# Patient Record
Sex: Female | Born: 1950 | Race: White | Hispanic: No | Marital: Single | Smoking: Current every day smoker
Health system: Southern US, Community
[De-identification: ages and names within clinical notes are randomized; demographics above are authoritative.]

## PROBLEM LIST (undated history)

## (undated) DIAGNOSIS — F419 Anxiety disorder, unspecified: Secondary | ICD-10-CM

## (undated) HISTORY — DX: Anxiety disorder, unspecified: F41.9

## (undated) HISTORY — PX: TUBAL LIGATION: SHX77

---

## 2014-09-12 DIAGNOSIS — F329 Major depressive disorder, single episode, unspecified: Secondary | ICD-10-CM | POA: Insufficient documentation

## 2014-09-12 DIAGNOSIS — F419 Anxiety disorder, unspecified: Secondary | ICD-10-CM

## 2015-10-30 ENCOUNTER — Encounter: Payer: Self-pay | Admitting: Emergency Medicine

## 2015-10-30 ENCOUNTER — Ambulatory Visit
Admission: EM | Admit: 2015-10-30 | Discharge: 2015-10-30 | Disposition: A | Discharge status: Home / Self Care | Payer: Self-pay | Attending: Family Medicine | Admitting: Family Medicine

## 2015-10-30 DIAGNOSIS — Z720 Tobacco use: Secondary | ICD-10-CM

## 2015-10-30 DIAGNOSIS — J209 Acute bronchitis, unspecified: Secondary | ICD-10-CM

## 2015-10-30 DIAGNOSIS — J40 Bronchitis, not specified as acute or chronic: Secondary | ICD-10-CM

## 2015-10-30 MED ORDER — PREDNISONE 10 MG (21) PO TBPK: ORAL_TABLET | ORAL | Status: DC

## 2015-10-30 MED ORDER — ALBUTEROL SULFATE HFA 108 (90 BASE) MCG/ACT IN AERS: 2.0000 | INHALATION_SPRAY | RESPIRATORY_TRACT | Status: DC | PRN

## 2015-10-30 MED ORDER — AZITHROMYCIN 500 MG PO TABS: ORAL_TABLET | ORAL | Status: DC

## 2015-10-30 MED ORDER — HYDROCOD POLST-CPM POLST ER 10-8 MG/5ML PO SUER: 5.0000 mL | Freq: Two times a day (BID) | ORAL | Status: DC | PRN

## 2015-10-30 NOTE — ED Provider Notes (Signed)
CSN: [id number]     Arrival date & time 10/30/15  [id number] History   First MD  Nurses notes were reviewed.  Chief Complaint  Patient presents with  . Cough  . Sinusitis  . Nasal Congestion   Since here because of cough. She says she's been coughing since April. She [place] [facility] [place] causing her to cough and causing her to have shortness of breath. She states the last 2 weeks the shortness of breath is increased she's noticed wheezing and bronchospasm which she doesn't normally have. I did point out to her that at this [facility] they're not bleeding and is she sure the past was causing her shortness of breath and wheezing versus a smoking that she's done for most her life. She did inform me that she knows the smoking is not good and that is still spelled the [facility] when she goes outside she still feels cystic [place] tree is causing her shortness of breath I have discussed with her that I think she may have COPD   (Consider location/radiation/quality/duration/timing/severity/associated sxs/prior Treatment) Patient is a 64 y.o. female presenting with URI. The history is provided by the patient. No language interpreter was used.  URI Presenting symptoms: congestion and rhinorrhea   Severity:  Moderate Onset quality:  Gradual Progression:  Worsening Chronicity:  Chronic Relieved by:  Nothing Associated symptoms: wheezing   Risk factors: being elderly   Risk factors: no chronic kidney disease, no immunosuppression and no recent illness     History reviewed. No pertinent past medical history. Past Surgical History  Procedure Laterality Date  . Tubal ligation     History reviewed. No pertinent family history. Social History  Substance Use Topics  . Smoking status: Current Every Day Smoker    Types: Cigarettes  . Smokeless tobacco: None  . Alcohol Use: No   OB History    No data available     Review of Systems  HENT: Positive for congestion and  rhinorrhea.   Respiratory: Positive for shortness of breath and wheezing.   Cardiovascular: Negative for chest pain.  All other systems reviewed and are negative.   Allergies  Review of patient's allergies indicates no known allergies.  Home Medications   Prior to Admission medications   Medication Sig Start Date End Date Taking? Authorizing Provider  albuterol (PROVENTIL HFA;VENTOLIN HFA) 108 (90 Base) MCG/ACT inhaler Inhale 2 puffs into the lungs every 4 (four) hours as needed for wheezing or shortness of breath. 10/30/15   Hassan RowanEugene Haidan Nhan, MD  azithromycin (ZITHROMAX) 500 MG tablet 1 tablet by mouth daily for 5 days 10/30/15   Hassan RowanEugene Jaydence Vanyo, MD  chlorpheniramine-HYDROcodone [facility](TUSSIONEX PENNKINETIC ER) 10-8 MG/5ML SUER Take 5 mLs by mouth every 12 (twelve) hours as needed for cough. 10/30/15   Hassan RowanEugene Terrica Duecker, MD  predniSONE (STERAPRED UNI-PAK 21 TAB) 10 MG (21) TBPK tablet Sig 6 tablet day 1, 5 tablets day 2, 4 tablets day 3,,3tablets day 4, 2 tablets day 5, 1 tablet day 6 take all tablets orally 10/30/15   Hassan RowanEugene Smith Potenza, MD   Meds Ordered and Administered this Visit  Medications - No data to display  BP 157/62 mmHg  Pulse 64  Temp(Src) 97 F (36.1 C) (Tympanic)  Resp 17  Ht 5\' 7"  (1.702 m)  Wt 140 lb (63.504 kg)  BMI 21.92 kg/m2  SpO2 98% No data found.   Physical Exam  Constitutional: She is oriented to person, place, and time. No distress.  Malaise elderly white female looks older than stated age.  HENT:  Head: Normocephalic and atraumatic.  Right Ear: External ear normal.  Left Ear: External ear normal.  Eyes: Pupils are equal, round, and reactive to light.  Neck: Normal range of motion. Neck supple.  Cardiovascular: Normal heart sounds.   Pulmonary/Chest: No respiratory distress. She has wheezes.  Musculoskeletal: Normal range of motion.  Neurological: She is alert and oriented to person, place, and time. No cranial nerve deficit.  Skin: Skin is warm and dry. She is not  diaphoretic.  Psychiatric: She has a normal mood and affect.  Vitals reviewed.   ED Course  Procedures (including critical care time)  Labs Review Labs Reviewed - No data to display  Imaging Review No results found.   Visual Acuity Review  Right Eye Distance:   Left Eye Distance:   Bilateral Distance:    Right Eye Near:   Left Eye Near:    Bilateral Near:         MDM   1. Bronchitis with bronchospasm   2. Continuous tobacco abuse      Scans patient did not think she should have a chest x-ray. She is comfortable with my physical examination skills diagnosing her with pneumonia but I told her while they may be good pneumonias can be missed with just auscultation. Also concern that patient has not had a chest x-ray or CT scan with her history of smoking to evaluate her for COPD and early lung cancer. 6 complaint to me that she does want to have the chest x-ray now shot on her wishes. She does not have apparently insurance and is waiting until June 2017 for her birthday to be on Medicare. Explained to her things get worse she will need to come back and will need to go ahead with the chest x-ray. She declines work note and. Will give her Tussionex 1 teaspoon twice a day Zithromax 500 daily for 5 days, albuterol rescue inhaler, and also on 6 day course of prednisone for the bronchospasm.      Hassan Rowan, MD 10/30/15 [phone]

## 2015-10-30 NOTE — ED Notes (Signed)
Patient c/o cough and chest congestion that has gotten worse over the past month.

## 2015-10-30 NOTE — Discharge Instructions (Signed)
Strongly suggest chest x-ray today. If cough and congestion continues with once again recommend obtaining a chest x-ray. Also establishing a PCP as soon as possible. Upper Respiratory Infection, Adult Most upper respiratory infections (URIs) are caused by a virus. A URI affects the nose, throat, and upper air passages. The most common type of URI is often called "the common cold." HOME CARE   Take medicines only as told by your doctor.  Gargle warm saltwater or take cough drops to comfort your throat as told by your doctor.  Use a warm mist humidifier or inhale steam from a shower to increase air moisture. This may make it easier to breathe.  Drink enough fluid to keep your pee (urine) clear or pale yellow.  Eat soups and other clear broths.  Have a healthy diet.  Rest as needed.  Go back to work when your fever is gone or your doctor says it is okay.  You may need to stay home longer to avoid giving your URI to others.  You can also wear a face mask and wash your hands often to prevent spread of the virus.  Use your inhaler more if you have asthma.  Do not use any tobacco products, including cigarettes, chewing tobacco, or electronic cigarettes. If you need help quitting, ask your doctor. GET HELP IF:  You are getting worse, not better.  Your symptoms are not helped by medicine.  You have chills.  You are getting more short of breath.  You have brown or red mucus.  You have yellow or brown discharge from your nose.  You have pain in your face, especially when you bend forward.  You have a fever.  You have puffy (swollen) neck glands.  You have pain while swallowing.  You have white areas in the back of your throat. GET HELP RIGHT AWAY IF:   You have very bad or constant:  Headache.  Ear pain.  Pain in your forehead, behind your eyes, and over your cheekbones (sinus pain).  Chest pain.  You have long-lasting (chronic) lung disease and any of the  following:  Wheezing.  Long-lasting cough.  Coughing up blood.  A change in your usual mucus.  You have a stiff neck.  You have changes in your:  Vision.  Hearing.  Thinking.  Mood. MAKE SURE YOU:   Understand these instructions.  Will watch your condition.  Will get help right away if you are not doing well or get worse.   This information is not intended to replace advice given to you by your health care provider. Make sure you discuss any questions you have with your health care provider.   Document Released: 04/05/2008 Document Revised: 03/04/2015 Document Reviewed: 01/23/2014 Elsevier Interactive Patient Education 2016 Elsevier Inc.   Metered Dose Inhaler (No Spacer Used) Inhaled medicines are the basis of treatment for asthma and other breathing problems. Inhaled medicine can only be effective if used properly. Good technique assures that the medicine reaches the lungs. Metered dose inhalers (MDIs) are used to deliver a variety of inhaled medicines. These include quick relief or rescue medicines (such as bronchodilators) and controller medicines (such as corticosteroids). The medicine is delivered by pushing down on a metal canister to release a set amount of spray. If you are using different kinds of inhalers, use your quick relief medicine to open the airways 10-15 minutes before using a steroid, if instructed to do so by your health care provider. If you are unsure which inhalers to  use and the order of using them, ask your health care provider, nurse, or respiratory therapist. HOW TO USE THE INHALER  Remove the cap from the inhaler.  If you are using the inhaler for the first time, you will need to prime it. Shake the inhaler for 5 seconds and release four puffs into the air, away from your face. Ask your health care provider or pharmacist if you have questions about priming your inhaler.  Shake the inhaler for 5 seconds before each breath in  (inhalation).  Position the inhaler so that the top of the canister faces up.  Put your index finger on the top of the medicine canister. Your thumb supports the bottom of the inhaler.  Open your mouth.  Either place the inhaler between your teeth and place your lips tightly around the mouthpiece, or hold the inhaler 1-2 inches away from your open mouth. If you are unsure of which technique to use, ask your health care provider.  Breathe out (exhale) normally and as completely as possible.  Press the canister down with the index finger to release the medicine.  At the same time as the canister is pressed, inhale deeply and slowly until your lungs are completely filled. This should take 4-6 seconds. Keep your tongue down.  Hold the medicine in your lungs for 5-10 seconds (10 seconds is best). This helps the medicine get into the small airways of your lungs.  Breathe out slowly, through pursed lips. Whistling is an example of pursed lips.  Wait at least 1 minute between puffs. Continue with the above steps until you have taken the number of puffs your health care provider has ordered. Do not use the inhaler more than your health care provider directs you to.  Replace the cap on the inhaler.  Follow the directions from your health care provider or the inhaler insert for cleaning the inhaler. If you are using a steroid inhaler, after your last puff, rinse your mouth with water, gargle, and spit out the water. Do not swallow the water. AVOID:  Inhaling before or after starting the spray of medicine. It takes practice to coordinate your breathing with triggering the spray.  Inhaling through the nose (rather than the mouth) when triggering the spray. HOW TO DETERMINE IF YOUR INHALER IS FULL OR NEARLY EMPTY You cannot know when an inhaler is empty by shaking it. Some inhalers are now being made with dose counters. Ask your health care provider for a prescription that has a dose counter if you  feel you need that extra help. If your inhaler does not have a counter, ask your health care provider to help you determine the date you need to refill your inhaler. Write the refill date on a calendar or your inhaler canister. Refill your inhaler 7-10 days before it runs out. Be sure to keep an adequate supply of medicine. This includes making sure it has not expired, and making sure you have a spare inhaler. SEEK MEDICAL CARE IF:  Symptoms are only partially relieved with your inhaler.  You are having trouble using your inhaler.  You experience an increase in phlegm. SEEK IMMEDIATE MEDICAL CARE IF:  You feel little or no relief with your inhalers. You are still wheezing and feeling shortness of breath, tightness in your chest, or both.  You have dizziness, headaches, or a fast heart rate.  You have chills, fever, or night sweats.  There is a noticeable increase in phlegm production, or there is blood in  the phlegm. MAKE SURE YOU:  Understand these instructions.  Will watch your condition.  Will get help right away if you are not doing well or get worse.   This information is not intended to replace advice given to you by your health care provider. Make sure you discuss any questions you have with your health care provider.   Document Released: 08/15/2007 Document Revised: 11/08/2014 Document Reviewed: 04/05/2013 Elsevier Interactive Patient Education 2016 [employer].  Steps to Quit Smoking  Smoking tobacco can be harmful to your health and can affect almost every organ in your body. Smoking puts you, and those around you, at risk for developing many serious chronic diseases. Quitting smoking is difficult, but it is one of the best things that you can do for your health. It is never too late to quit. WHAT ARE THE BENEFITS OF QUITTING SMOKING? When you quit smoking, you lower your risk of developing serious diseases and conditions, such as:  Lung cancer or lung disease, such as  COPD.  Heart disease.  Stroke.  Heart attack.  Infertility.  Osteoporosis and bone fractures. Additionally, symptoms such as coughing, wheezing, and shortness of breath may get better when you quit. You may also find that you get sick less often because your body is stronger at fighting off colds and infections. If you are pregnant, quitting smoking can help to reduce your chances of having a baby of low birth weight. HOW DO I GET READY TO QUIT? When you decide to quit smoking, create a plan to make sure that you are successful. Before you quit:  Pick a date to quit. Set a date within the next two weeks to give you time to prepare.  Write down the reasons why you are quitting. Keep this list in places where you will see it often, such as on your bathroom mirror or in your car or wallet.  Identify the people, places, things, and activities that make you want to smoke (triggers) and avoid them. Make sure to take these actions:  Throw away all cigarettes at home, at work, and in your car.  Throw away smoking accessories, such as Set designer.  Clean your car and make sure to empty the ashtray.  Clean your home, including curtains and carpets.  Tell your family, friends, and coworkers that you are quitting. Support from your loved ones can make quitting easier.  Talk with your health care provider about your options for quitting smoking.  Find out what treatment options are covered by your health insurance. WHAT STRATEGIES CAN I USE TO QUIT SMOKING?  Talk with your healthcare provider about different strategies to quit smoking. Some strategies include:  Quitting smoking altogether instead of gradually lessening how much you smoke over a period of time. Research shows that quitting "cold [place]" is more successful than gradually quitting.  Attending in-person counseling to help you build problem-solving skills. You are more likely to have success in quitting if you attend  several counseling sessions. Even short sessions of 10 minutes can be effective.  Finding resources and support systems that can help you to quit smoking and remain smoke-free after you quit. These resources are most helpful when you use them often. They can include:  Online chats with a Veterinary surgeon.  Telephone quitlines.  Printed Materials engineer.  Support groups or group counseling.  Text messaging programs.  Mobile phone applications.  Taking medicines to help you quit smoking. (If you are pregnant or breastfeeding, talk with your health  care provider first.) Some medicines contain nicotine and some do not. Both types of medicines help with cravings, but the medicines that include nicotine help to relieve withdrawal symptoms. Your health care provider may recommend:  Nicotine patches, gum, or lozenges.  Nicotine inhalers or sprays.  Non-nicotine medicine that is taken by mouth. Talk with your health care provider about combining strategies, such as taking medicines while you are also receiving in-person counseling. Using these two strategies together makes you more likely to succeed in quitting than if you used either strategy on its own. If you are pregnant or breastfeeding, talk with your health care provider about finding counseling or other support strategies to quit smoking. Do not take medicine to help you quit smoking unless told to do so by your health care provider. WHAT THINGS CAN I DO TO MAKE IT EASIER TO QUIT? Quitting smoking might feel overwhelming at first, but there is a lot that you can do to make it easier. Take these important actions:  Reach out to your family and friends and ask that they support and encourage you during this time. Call telephone quitlines, reach out to support groups, or work with a counselor for support.  Ask people who smoke to avoid smoking around you.  Avoid places that trigger you to smoke, such as bars, parties, or smoke-break areas at  work.  Spend time around people who do not smoke.  Lessen stress in your life, because stress can be a smoking trigger for some people. To lessen stress, try:  Exercising regularly.  Deep-breathing exercises.  Yoga.  Meditating.  Performing a body scan. This involves closing your eyes, scanning your body from head to toe, and noticing which parts of your body are particularly tense. Purposefully relax the muscles in those areas.  Download or purchase mobile phone or tablet apps (applications) that can help you stick to your quit plan by providing reminders, tips, and encouragement. There are many free apps, such as QuitGuide from the [employer] developer for Disease Control and Prevention). You can find other support for quitting smoking (smoking cessation) through smokefree.gov and other websites. HOW WILL I FEEL WHEN I QUIT SMOKING? Within the first 24 hours of quitting smoking, you may start to feel some withdrawal symptoms. These symptoms are usually most noticeable 2-3 days after quitting, but they usually do not last beyond 2-3 weeks. Changes or symptoms that you might experience include:  Mood swings.  Restlessness, anxiety, or irritation.  Difficulty concentrating.  Dizziness.  Strong cravings for sugary foods in addition to nicotine.  Mild weight gain.  Constipation.  Nausea.  Coughing or a sore throat.  Changes in how your medicines work in your body.  A depressed mood.  Difficulty sleeping (insomnia). After the first 2-3 weeks of quitting, you may start to notice more positive results, such as:  Improved sense of smell and taste.  Decreased coughing and sore throat.  Slower heart rate.  Lower blood pressure.  Clearer skin.  The ability to breathe more easily.  Fewer sick days. Quitting smoking is very challenging for most people. Do not get discouraged if you are not successful the first time. Some people need to make many attempts to quit before they  achieve long-term success. Do your best to stick to your quit plan, and talk with your health care provider if you have any questions or concerns.   This information is not intended to replace advice given to you by your health care provider. Make sure  you discuss any questions you have with your health care provider.   Document Released: 10/12/2001 Document Revised: 03/04/2015 Document Reviewed: 03/04/2015 Elsevier Interactive Patient Education [employer]2016 Elsevier Inc.

## 2016-04-09 ENCOUNTER — Ambulatory Visit
Admission: EM | Admit: 2016-04-09 | Discharge: 2016-04-09 | Disposition: A | Discharge status: Home / Self Care | Payer: Medicare Other | Attending: Family Medicine | Admitting: Family Medicine

## 2016-04-09 DIAGNOSIS — K602 Anal fissure, unspecified: Secondary | ICD-10-CM

## 2016-04-09 DIAGNOSIS — K921 Melena: Secondary | ICD-10-CM | POA: Diagnosis not present

## 2016-04-09 NOTE — Discharge Instructions (Signed)
Anal Fissure, Adult An anal fissure is a small tear or crack in the skin around the anus. Bleeding from a fissure usually stops on its own within a few minutes. However, bleeding will often occur again with each bowel movement until the crack heals. CAUSES This condition may be caused by:  Passing large, hard stool (feces).  Frequent diarrhea.  Constipation.  Inflammatory bowel disease (Crohn disease or ulcerative colitis).  Infections.  Anal sex. SYMPTOMS Symptoms of this condition include:  Bleeding from the rectum.  Small amounts of blood seen on your stool, on toilet paper, or in the toilet after a bowel movement.  Painful bowel movements.  Itching or irritation around the anus. DIAGNOSIS A health care provider may diagnose this condition by closely examining the anal area. An anal fissure can usually be seen with careful inspection. In some cases, a rectal exam may be performed, or a short tube (anoscope) may be used to examine the anal canal. TREATMENT Treatment for this condition may include:  Taking steps to avoid constipation. This may include making changes to your diet, such as increasing your intake of fiber or fluid.  Taking fiber supplements. These supplements can soften your stool to help make bowel movements easier. Your health care provider may also prescribe a stool softener if your stool is often hard.  Taking sitz baths. This may help to heal the tear.  Using medicated creams or ointments. These may be prescribed to lessen discomfort. HOME CARE INSTRUCTIONS Eating and Drinking  Avoid foods that may be constipating, such as bananas and dairy products.  Drink enough fluid to keep your urine clear or pale yellow.  Maintain a diet that is high in fruits, whole grains, and vegetables. General Instructions  Keep the anal area as clean and dry as possible.  Take sitz baths as told by your health care provider. Do not use soap in the sitz baths.  Take  over-the-counter and prescription medicines only as told by your health care provider.  Use creams or ointments only as told by your health care provider.  Keep all follow-up visits as told by your health care provider. This is important. SEEK MEDICAL CARE IF:  You have more bleeding.  You have a fever.  You have diarrhea that is mixed with blood.  You continue to have pain.  Your problem is getting worse rather than better.   This information is not intended to replace advice given to you by your health care provider. Make sure you discuss any questions you have with your health care provider.   Document Released: 10/18/2005 Document Revised: 07/09/2015 Document Reviewed: 01/13/2015 Elsevier Interactive Patient Education 2016 Elsevier Inc.   Recommend schedule appointment/establish care with PCP and schedule appointment with Gastroenterologist for screening colonoscopy

## 2016-04-09 NOTE — ED Notes (Signed)
Pt c/o abd cramping with 2 loose stools this morning with bright red blood mixed in the stool.

## 2016-04-09 NOTE — ED Provider Notes (Signed)
CSN: [id number]     Arrival date & time 04/09/16  0932 History   None    Chief Complaint  Patient presents with  . Rectal Bleeding   (Consider location/radiation/quality/duration/timing/severity/associated sxs/prior Treatment) HPI Comments: 65 yo female with a c/o bright red blood in toilet this morning after 2 loose stools, associated with mild abdominal cramping. Currently not having any pain, cramping, discomfort. Denies any fevers, chills, nausea/vomiting, unexplained weight loss, sick contacts.  Patient states she has been constipated for the last 3 days, but this morning had 2 small to medium loose stools. States has never had a screening colonoscopy.  Patient is a 65 y.o. female presenting with hematochezia. The history is provided by the patient.  Rectal Bleeding   History reviewed. No pertinent past medical history. Past Surgical History  Procedure Laterality Date  . Tubal ligation     No family history on file. Social History  Substance Use Topics  . Smoking status: Current Every Day Smoker    Types: Cigarettes  . Smokeless tobacco: None  . Alcohol Use: No   OB History    No data available     Review of Systems  Gastrointestinal: Positive for hematochezia.    Allergies  Review of patient's allergies indicates no known allergies.  Home Medications   Prior to Admission medications   Medication Sig Start Date End Date Taking? Authorizing Provider  albuterol (PROVENTIL HFA;VENTOLIN HFA) 108 (90 Base) MCG/ACT inhaler Inhale 2 puffs into the lungs every 4 (four) hours as needed for wheezing or shortness of breath. 10/30/15   Hassan Rowan, MD  azithromycin (ZITHROMAX) 500 MG tablet 1 tablet by mouth daily for 5 days 10/30/15   Hassan Rowan, MD  chlorpheniramine-HYDROcodone [facility] ER) 10-8 MG/5ML SUER Take 5 mLs by mouth every 12 (twelve) hours as needed for cough. 10/30/15   Hassan Rowan, MD  predniSONE (STERAPRED UNI-PAK 21 TAB) 10 MG (21) TBPK tablet Sig  6 tablet day 1, 5 tablets day 2, 4 tablets day 3,,3tablets day 4, 2 tablets day 5, 1 tablet day 6 take all tablets orally 10/30/15   Hassan Rowan, MD   Meds Ordered and Administered this Visit  Medications - No data to display  BP 156/82 mmHg  Pulse 78  Temp(Src) 98 F (36.7 C) (Oral)  Resp 17  Ht  (1.702 m)  Wt 142 lb (64.411 kg)  BMI 22.24 kg/m2  SpO2 100% No data found.   Physical Exam  Constitutional: She appears well-developed and well-nourished. No distress.  Abdominal: Soft. Bowel sounds are normal. She exhibits no distension and no mass. There is no tenderness. There is no rebound and no guarding.  Genitourinary:    Pelvic exam was performed with patient supine.  Non-actively bleeding anal fissure at 6 o'clock position and non-inflamed hemorrhoid at 12 o'clock position  Skin: She is not diaphoretic.  Nursing note and vitals reviewed.   ED Course  Procedures (including critical care time)  Labs Review Labs Reviewed - No data to display  Imaging Review No results found.   Visual Acuity Review  Right Eye Distance:   Left Eye Distance:   Bilateral Distance:    Right Eye Near:   Left Eye Near:    Bilateral Near:         MDM   1. Anal fissure   2. Hematochezia   (discussed with patient hematochezia/bright red blood, possibly/likely secondary to anal fissure, however can not rule out intestinal pathology and strongly recommend patient have  a screening colonoscopy as she has never had one)    Discharge Medication List as of 04/09/2016 11:01 AM     1. diagnosis reviewed with patient 2. Recommend supportive treatment with TUCKS PADS, sitz baths 3. Patient does not have a PCP and has never had a colonoscopy; discussed with patient need/recommendation for screening colonoscopy as they she is overdue; patient verbalizes understanding and states will follow up on this. 4. Follow-up prn if symptoms worsen or don't improve    Payton Mccallumrlando Draya Felker,  MD 04/09/16 1114

## 2016-04-13 ENCOUNTER — Other Ambulatory Visit: Payer: Self-pay

## 2016-04-13 ENCOUNTER — Ambulatory Visit (INDEPENDENT_AMBULATORY_CARE_PROVIDER_SITE_OTHER): Payer: Medicare Other | Admitting: Family Medicine

## 2016-04-13 ENCOUNTER — Encounter: Payer: Self-pay | Admitting: Family Medicine

## 2016-04-13 VITALS — BP 138/78 | HR 81 | Resp 16 | Ht 67.0 in | Wt 139.0 lb

## 2016-04-13 DIAGNOSIS — Z720 Tobacco use: Secondary | ICD-10-CM

## 2016-04-13 DIAGNOSIS — F418 Other specified anxiety disorders: Secondary | ICD-10-CM | POA: Diagnosis not present

## 2016-04-13 DIAGNOSIS — R269 Unspecified abnormalities of gait and mobility: Secondary | ICD-10-CM | POA: Diagnosis not present

## 2016-04-13 DIAGNOSIS — F17200 Nicotine dependence, unspecified, uncomplicated: Secondary | ICD-10-CM | POA: Insufficient documentation

## 2016-04-13 DIAGNOSIS — F419 Anxiety disorder, unspecified: Principal | ICD-10-CM

## 2016-04-13 DIAGNOSIS — E559 Vitamin D deficiency, unspecified: Secondary | ICD-10-CM | POA: Diagnosis not present

## 2016-04-13 DIAGNOSIS — F329 Major depressive disorder, single episode, unspecified: Secondary | ICD-10-CM

## 2016-04-13 DIAGNOSIS — Z23 Encounter for immunization: Secondary | ICD-10-CM | POA: Diagnosis not present

## 2016-04-13 DIAGNOSIS — G2581 Restless legs syndrome: Secondary | ICD-10-CM | POA: Diagnosis not present

## 2016-04-13 MED ORDER — ZOSTER VACCINE LIVE 19400 UNT/0.65ML ~~LOC~~ SUSR: 0.6500 mL | Freq: Once | SUBCUTANEOUS | Status: DC

## 2016-04-13 NOTE — Progress Notes (Signed)
Date:  04/13/2016   Name:  Brenda Briggs   DOB:  05/31/1951   MRN:  [id number]  PCP:  No PCP Per Patient    Chief Complaint: Establish Care   History of Present Illness:  This is a 65 y.o. female seen for initial visit. Hx anxiety/depression on Wellbutrin and Paxil in past, now manages without medication. Still has significant anxiety and doesn't sleep well but not interested in medication at present. 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 Stubbed toes L foot 1.5 weeks ago with some bruising, improving, able to move toes without pain. C/o RLS R > L since menopause, avoids caffeine after 4PM, keeps up at night. Father died kidney ca, mother died MI, both with EtOHism. Last tetanus imm 2009, no hx pneumo or shingles imm. Declines colonoscopy.  Review of Systems:  Review of Systems  Constitutional: Negative for fever and fatigue.  Respiratory: Negative for cough and shortness of breath.   Cardiovascular: Negative for chest pain and leg swelling.  Endocrine: Negative for polyuria.  Genitourinary: Negative for difficulty urinating.  Neurological: Negative for syncope and light-headedness.    Patient Active Problem List   Diagnosis Date Noted  . Smoker 04/13/2016  . Restless leg syndrome 04/13/2016  . Anxiety and depression 09/12/2014    Prior to Admission medications   Medication Sig Start Date End Date Taking? Authorizing Provider  acetaminophen (TYLENOL) 325 MG tablet Take 650 mg by mouth every 6 (six) hours as needed.   Yes Historical Provider, MD  Zoster Vaccine Live, PF, (ZOSTAVAX) 40981 UNT/0.65ML injection Inject 19,400 Units into the skin once. 04/13/16   Schuyler Amor, MD    No Known Allergies  Past Surgical History  Procedure Laterality Date  . Tubal ligation      Social History  Substance Use Topics  . Smoking status: Current Every Day Smoker    Types: Cigarettes  . Smokeless tobacco: None  . Alcohol Use: No    Family History  Problem Relation Age of  Onset  . Heart attack Mother     Medication list has been reviewed and updated.  Physical Examination: BP 138/78 mmHg  Pulse 81  Resp 16  Ht  (1.702 m)  Wt 139 lb (63.05 kg)  BMI 21.77 kg/m2  SpO2 98%  LMP  (LMP Unknown)  Physical Exam  Constitutional: She is oriented to person, place, and time. She appears well-developed and well-nourished.  HENT:  Right Ear: External ear normal.  Left Ear: External ear normal.  Mouth/Throat: Oropharynx is clear and moist.  TM's clear  Eyes: Conjunctivae and EOM are normal. Pupils are equal, round, and reactive to light.  Neck: Neck supple. No thyromegaly present.  Cardiovascular: Normal rate, regular rhythm and normal heart sounds.   Pulmonary/Chest: Effort normal and breath sounds normal.  Abdominal: Soft. She exhibits no distension and no mass. There is no tenderness.  Musculoskeletal: She exhibits no edema.  Lymphadenopathy:    She has no cervical adenopathy.  Neurological: She is alert and oriented to person, place, and time. Coordination normal.  Gait sl wide-based  Skin: Skin is warm and dry.  Psychiatric: She has a normal mood and affect. Her behavior is normal.  Nursing note and vitals reviewed.   Assessment and Plan:  1. Anxiety and depression Declines medication at present - TSH  2. Restless leg syndrome Avoidance info given, consider medication if persists - Comprehensive metabolic panel - CBC  3. Vitamin D deficiency No current supplementation - Vitamin D (25 hydroxy)  4. Gait abnormality Consider PT eval if blood work ok - B12  5. Need for pneumococcal vaccination - Pneumococcal conjugate vaccine 13-valent  6. Smoking Advised cessation - Nurse to provide smoking / tobacco cessation education  Return in about 6 months (around 10/13/2016).  Dionne AnoWilliam M. Kingsley SpittlePlonk, Jr. MD [facility]  04/13/2016

## 2016-04-13 NOTE — Patient Instructions (Signed)
Restless Legs Syndrome Restless legs syndrome is a condition that causes uncomfortable feelings or sensations in the legs, especially while sitting or lying down. The sensations usually cause an overwhelming urge to move the legs. The arms can also sometimes be affected. The condition can range from mild to severe. The symptoms often interfere with a person's ability to sleep. CAUSES The cause of this condition is not known. RISK FACTORS This condition is more likely to develop in:  People who are older than age 50.  Pregnant women. In general, restless legs syndrome is more common in women than in men.  People who have a family history of the condition.  People who have certain medical conditions, such as iron deficiency, kidney disease, Parkinson disease, or nerve damage.  People who take certain medicines, such as medicines for high blood pressure, nausea, colds, allergies, depression, and some heart conditions. SYMPTOMS The main symptom of this condition is uncomfortable sensations in the legs. These sensations may be:  Described as pulling, tingling, prickling, throbbing, crawling, or burning.  Worse while you are sitting or lying down.  Worse during periods of rest or inactivity.  Worse at night, often interfering with your sleep.  Accompanied by a very strong urge to move your legs.  Temporarily relieved by movement of your legs. The sensations usually affect both sides of the body. The arms can also be affected, but this is rare. People who have this condition often have tiredness during the day because of their lack of sleep at night. DIAGNOSIS This condition may be diagnosed based on your description of the symptoms. You may also have tests, including blood tests, to check for other conditions that may lead to your symptoms. In some cases, you may be asked to spend some time in a sleep lab so your sleeping can be monitored. TREATMENT Treatment for this condition is  focused on managing the symptoms. Treatment may include:  Self-help and lifestyle changes.  Medicines. HOME CARE INSTRUCTIONS  Take medicines only as directed by your health care provider.  Try these methods to get temporary relief from the uncomfortable sensations:  Massage your legs.  Walk or stretch.  Take a cold or hot bath.  Practice good sleep habits. For example, go to bed and get up at the same time every day.  Exercise regularly.  Practice ways of relaxing, such as yoga or meditation.  Avoid caffeine and alcohol.  Do not use any tobacco products, including cigarettes, chewing tobacco, or electronic cigarettes. If you need help quitting, ask your health care provider.  Keep all follow-up visits as directed by your health care provider. This is important. SEEK MEDICAL CARE IF: Your symptoms do not improve with treatment, or they get worse.   This information is not intended to replace advice given to you by your health care provider. Make sure you discuss any questions you have with your health care provider.   Document Released: 10/08/2002 Document Revised: 03/04/2015 Document Reviewed: 10/14/2014 Elsevier Interactive Patient Education 2016 [employer]  

## 2016-04-14 ENCOUNTER — Other Ambulatory Visit: Payer: Self-pay

## 2016-04-14 LAB — COMPREHENSIVE METABOLIC PANEL
ALBUMIN: 4.4 g/dL (ref 3.6–4.8)
ALT: 12 IU/L (ref 0–32)
AST: 18 IU/L (ref 0–40)
Albumin/Globulin Ratio: 1.5 (ref 1.2–2.2)
Alkaline Phosphatase: 94 IU/L (ref 39–117)
BILIRUBIN TOTAL: 0.4 mg/dL (ref 0.0–1.2)
BUN/Creatinine Ratio: 28 (ref 12–28)
BUN: 15 mg/dL (ref 8–27)
CO2: 23 mmol/L (ref 18–29)
CREATININE: 0.54 mg/dL — AB (ref 0.57–1.00)
Calcium: 9.8 mg/dL (ref 8.7–10.3)
Chloride: 99 mmol/L (ref 96–106)
GFR, EST AFRICAN AMERICAN: 115 mL/min/{1.73_m2} (ref >59)
GFR, EST NON AFRICAN AMERICAN: 99 mL/min/{1.73_m2} (ref >59)
GLOBULIN, TOTAL: 3 g/dL (ref 1.5–4.5)
GLUCOSE: 73 mg/dL (ref 65–99)
Potassium: 3.8 mmol/L (ref 3.5–5.2)
Sodium: 141 mmol/L (ref 134–144)
TOTAL PROTEIN: 7.4 g/dL (ref 6.0–8.5)

## 2016-04-14 LAB — CBC
HEMATOCRIT: 40.1 % (ref 34.0–46.6)
HEMOGLOBIN: 14.1 g/dL (ref 11.1–15.9)
MCH: 32.8 pg (ref 26.6–33.0)
MCHC: 35.2 g/dL (ref 31.5–35.7)
MCV: 93 fL (ref 79–97)
Platelets: 300 10*3/uL (ref 150–379)
RBC: 4.3 x10E6/uL (ref 3.77–5.28)
RDW: 12.7 % (ref 12.3–15.4)
WBC: 7.6 10*3/uL (ref 3.4–10.8)

## 2016-04-14 LAB — TSH: TSH: 1.12 u[IU]/mL (ref 0.450–4.500)

## 2016-04-14 LAB — VITAMIN D 25 HYDROXY (VIT D DEFICIENCY, FRACTURES): VIT D 25 HYDROXY: 17.4 ng/mL — AB (ref 30.0–100.0)

## 2016-04-14 LAB — VITAMIN B12: Vitamin B-12: 310 pg/mL (ref 211–946)

## 2016-04-14 MED ORDER — VITAMIN D 50 MCG (2000 UT) PO CAPS: 1.0000 | ORAL_CAPSULE | Freq: Every day | ORAL | Status: AC

## 2016-04-14 NOTE — Addendum Note (Signed)
Addended by: Schuyler AmorPLONK, Lyndon Chapel on: 04/14/2016 09:09 AM   Modules accepted: Orders

## 2016-10-13 ENCOUNTER — Ambulatory Visit: Payer: Medicare Other | Admitting: Family Medicine

## 2017-02-01 ENCOUNTER — Ambulatory Visit (INDEPENDENT_AMBULATORY_CARE_PROVIDER_SITE_OTHER): Payer: Medicare Other | Admitting: Family Medicine

## 2017-02-01 ENCOUNTER — Encounter: Payer: Self-pay | Admitting: Family Medicine

## 2017-02-01 VITALS — BP 126/82 | HR 67 | Temp 98.1°F | Resp 16 | Ht 67.0 in | Wt 141.0 lb

## 2017-02-01 DIAGNOSIS — F17200 Nicotine dependence, unspecified, uncomplicated: Secondary | ICD-10-CM

## 2017-02-01 DIAGNOSIS — J3089 Other allergic rhinitis: Secondary | ICD-10-CM

## 2017-02-01 DIAGNOSIS — E559 Vitamin D deficiency, unspecified: Secondary | ICD-10-CM | POA: Diagnosis not present

## 2017-02-01 DIAGNOSIS — G2581 Restless legs syndrome: Secondary | ICD-10-CM | POA: Diagnosis not present

## 2017-02-01 DIAGNOSIS — J309 Allergic rhinitis, unspecified: Secondary | ICD-10-CM | POA: Insufficient documentation

## 2017-02-01 MED ORDER — TRIAMCINOLONE ACETONIDE 55 MCG/ACT NA AERO: 2.0000 | INHALATION_SPRAY | Freq: Every day | NASAL | 12 refills | Status: DC

## 2017-02-01 MED ORDER — CETIRIZINE HCL 10 MG PO TABS: 10.0000 mg | ORAL_TABLET | Freq: Every day | ORAL | 11 refills | Status: DC

## 2017-02-01 MED ORDER — VARENICLINE TARTRATE 0.5 MG X 11 & 1 MG X 42 PO MISC: ORAL | 0 refills | Status: DC

## 2017-02-01 NOTE — Patient Instructions (Signed)
Steps to Quit Smoking Smoking tobacco can be harmful to your health and can affect almost every organ in your body. Smoking puts you, and those around you, at risk for developing many serious chronic diseases. Quitting smoking is difficult, but it is one of the best things that you can do for your health. It is never too late to quit. What are the benefits of quitting smoking? When you quit smoking, you lower your risk of developing serious diseases and conditions, such as:  Lung cancer or lung disease, such as COPD.  Heart disease.  Stroke.  Heart attack.  Infertility.  Osteoporosis and bone fractures. Additionally, symptoms such as coughing, wheezing, and shortness of breath may get better when you quit. You may also find that you get sick less often because your body is stronger at fighting off colds and infections. If you are pregnant, quitting smoking can help to reduce your chances of having a baby of low birth weight. How do I get ready to quit? When you decide to quit smoking, create a plan to make sure that you are successful. Before you quit:  Pick a date to quit. Set a date within the next two weeks to give you time to prepare.  Write down the reasons why you are quitting. Keep this list in places where you will see it often, such as on your bathroom mirror or in your car or wallet.  Identify the people, places, things, and activities that make you want to smoke (triggers) and avoid them. Make sure to take these actions:  Throw away all cigarettes at home, at work, and in your car.  Throw away smoking accessories, such as Scientist, research (medical).  Clean your car and make sure to empty the ashtray.  Clean your home, including curtains and carpets.  Tell your family, friends, and coworkers that you are quitting. Support from your loved ones can make quitting easier.  Talk with your health care provider about your options for quitting smoking.  Find out what treatment  options are covered by your health insurance. What strategies can I use to quit smoking? Talk with your healthcare provider about different strategies to quit smoking. Some strategies include:  Quitting smoking altogether instead of gradually lessening how much you smoke over a period of time. Research shows that quitting "cold [place]" is more successful than gradually quitting.  Attending in-person counseling to help you build problem-solving skills. You are more likely to have success in quitting if you attend several counseling sessions. Even short sessions of 10 minutes can be effective.  Finding resources and support systems that can help you to quit smoking and remain smoke-free after you quit. These resources are most helpful when you use them often. They can include:  Online chats with a Social worker.  Telephone quitlines.  Printed Furniture conservator/restorer.  Support groups or group counseling.  Text messaging programs.  Mobile phone applications.  Taking medicines to help you quit smoking. (If you are pregnant or breastfeeding, talk with your health care provider first.) Some medicines contain nicotine and some do not. Both types of medicines help with cravings, but the medicines that include nicotine help to relieve withdrawal symptoms. Your health care provider may recommend:  Nicotine patches, gum, or lozenges.  Nicotine inhalers or sprays.  Non-nicotine medicine that is taken by mouth. Talk with your health care provider about combining strategies, such as taking medicines while you are also receiving in-person counseling. Using these two strategies together makes you more  likely to succeed in quitting than if you used either strategy on its own. If you are pregnant or breastfeeding, talk with your health care provider about finding counseling or other support strategies to quit smoking. Do not take medicine to help you quit smoking unless told to do so by your health care  provider. What things can I do to make it easier to quit? Quitting smoking might feel overwhelming at first, but there is a lot that you can do to make it easier. Take these important actions:  Reach out to your family and friends and ask that they support and encourage you during this time. Call telephone quitlines, reach out to support groups, or work with a counselor for support.  Ask people who smoke to avoid smoking around you.  Avoid places that trigger you to smoke, such as bars, parties, or smoke-break areas at work.  Spend time around people who do not smoke.  Lessen stress in your life, because stress can be a smoking trigger for some people. To lessen stress, try:  Exercising regularly.  Deep-breathing exercises.  Yoga.  Meditating.  Performing a body scan. This involves closing your eyes, scanning your body from head to toe, and noticing which parts of your body are particularly tense. Purposefully relax the muscles in those areas.  Download or purchase mobile phone or tablet apps (applications) that can help you stick to your quit plan by providing reminders, tips, and encouragement. There are many free apps, such as QuitGuide from the [employer] developer for Disease Control and Prevention). You can find other support for quitting smoking (smoking cessation) through smokefree.gov and other websites. How will I feel when I quit smoking? Within the first 24 hours of quitting smoking, you may start to feel some withdrawal symptoms. These symptoms are usually most noticeable 2-3 days after quitting, but they usually do not last beyond 2-3 weeks. Changes or symptoms that you might experience include:  Mood swings.  Restlessness, anxiety, or irritation.  Difficulty concentrating.  Dizziness.  Strong cravings for sugary foods in addition to nicotine.  Mild weight gain.  Constipation.  Nausea.  Coughing or a sore throat.  Changes in how your medicines work in your  body.  A depressed mood.  Difficulty sleeping (insomnia). After the first 2-3 weeks of quitting, you may start to notice more positive results, such as:  Improved sense of smell and taste.  Decreased coughing and sore throat.  Slower heart rate.  Lower blood pressure.  Clearer skin.  The ability to breathe more easily.  Fewer sick days. Quitting smoking is very challenging for most people. Do not get discouraged if you are not successful the first time. Some people need to make many attempts to quit before they achieve long-term success. Do your best to stick to your quit plan, and talk with your health care provider if you have any questions or concerns. This information is not intended to replace advice given to you by your health care provider. Make sure you discuss any questions you have with your health care provider. Document Released: 10/12/2001 Document Revised: 06/15/2016 Document Reviewed: 03/04/2015 Elsevier Interactive Patient Education  2017 [employer]. Varenicline oral tablets What is this medicine? VARENICLINE (var EN i kleen) is used to help people quit smoking. It can reduce the symptoms caused by stopping smoking. It is used with a patient support program recommended by your physician. This medicine may be used for other purposes; ask your health care provider or pharmacist if  you have questions. COMMON BRAND NAME(S): Chantix What should I tell my health care provider before I take this medicine? They need to know if you have any of these conditions: -bipolar disorder, depression, schizophrenia or other mental illness -heart disease -if you often drink alcohol -kidney disease -peripheral vascular disease -seizures -stroke -suicidal thoughts, plans, or attempt; a previous suicide attempt by you or a family member -an unusual or allergic reaction to varenicline, other medicines, foods, dyes, or preservatives -pregnant or trying to get  pregnant -breast-feeding How should I use this medicine? Take this medicine by mouth after eating. Take with a full glass of water. Follow the directions on the prescription label. Take your doses at regular intervals. Do not take your medicine more often than directed. There are 3 ways you can use this medicine to help you quit smoking; talk to your health care professional to decide which plan is right for you: 1) you can choose a quit date and start this medicine 1 week before the quit date, or, 2) you can start taking this medicine before you choose a quit date, and then pick a quit date between day 8 and 35 days of treatment, or, 3) if you are not sure that you are able or willing to quit smoking right away, start taking this medicine and slowly decrease the amount you smoke as directed by your health care professional with the goal of being cigarette-free by week 12 of treatment. Stick to your plan; ask about support groups or other ways to help you remain cigarette-free. If you are motivated to quit smoking and did not succeed during a previous attempt with this medicine for reasons other than side effects, or if you returned to smoking after this treatment, speak with your health care professional about whether another course of this medicine may be right for you. A special MedGuide will be given to you by the pharmacist with each prescription and refill. Be sure to read this information carefully each time. Talk to your pediatrician regarding the use of this medicine in children. This medicine is not approved for use in children. Overdosage: If you think you have taken too much of this medicine contact a poison control center or emergency room at once. NOTE: This medicine is only for you. Do not share this medicine with others. What if I miss a dose? If you miss a dose, take it as soon as you can. If it is almost time for your next dose, take only that dose. Do not take double or extra  doses. What may interact with this medicine? -alcohol or any product that contains alcohol -insulin -other stop smoking aids -theophylline -warfarin This list may not describe all possible interactions. Give your health care provider a list of all the medicines, herbs, non-prescription drugs, or dietary supplements you use. Also tell them if you smoke, drink alcohol, or use illegal drugs. Some items may interact with your medicine. What should I watch for while using this medicine? Visit your doctor or health care professional for regular check ups. Ask for ongoing advice and encouragement from your doctor or healthcare professional, friends, and family to help you quit. If you smoke while on this medication, quit again Your mouth may get dry. Chewing sugarless gum or sucking hard candy, and drinking plenty of water may help. Contact your doctor if the problem does not go away or is severe. You may get drowsy or dizzy. Do not drive, use machinery, or do anything that  needs mental alertness until you know how this medicine affects you. Do not stand or sit up quickly, especially if you are an older patient. This reduces the risk of dizzy or fainting spells. Sleepwalking can happen during treatment with this medicine, and can sometimes lead to behavior that is harmful to you, other people, or property. Stop taking this medicine and tell your doctor if you start sleepwalking or have other unusual sleep-related activity. Decrease the amount of alcoholic beverages that you drink during treatment with this medicine until you know if this medicine affects your ability to tolerate alcohol. Some people have experienced increased drunkenness (intoxication), unusual or sometimes aggressive behavior, or no memory of things that have happened (amnesia) during treatment with this medicine. The use of this medicine may increase the chance of suicidal thoughts or actions. Pay special attention to how you are responding  while on this medicine. Any worsening of mood, or thoughts of suicide or dying should be reported to your health care professional right away. What side effects may I notice from receiving this medicine? Side effects that you should report to your doctor or health care professional as soon as possible: -allergic reactions like skin rash, itching or hives, swelling of the face, lips, tongue, or throat -acting aggressive, being angry or violent, or acting on dangerous impulses -breathing problems -changes in vision -chest pain or chest tightness -confusion, trouble speaking or understanding -new or worsening depression, anxiety, or panic attacks -extreme increase in activity and talking (mania) -fast, irregular heartbeat -feeling faint or lightheaded, falls -fever -pain in legs when walking -problems with balance, talking, walking -redness, blistering, peeling or loosening of the skin, including inside the mouth -ringing in ears -seeing or hearing things that aren't there (hallucinations) -seizures -sleepwalking -sudden numbness or weakness of the face, arm or leg -thoughts about suicide or dying, or attempts to commit suicide -trouble passing urine or change in the amount of urine -unusual bleeding or bruising -unusually weak or tired Side effects that usually do not require medical attention (report to your doctor or health care professional if they continue or are bothersome): -constipation -headache -nausea, vomiting -strange dreams -stomach gas -trouble sleeping This list may not describe all possible side effects. Call your doctor for medical advice about side effects. You may report side effects to FDA at 1-800-FDA-1088. Where should I keep my medicine? Keep out of the reach of children. Store at room temperature between 15 and 30 degrees C (59 and 86 degrees F). Throw away any unused medicine after the expiration date. NOTE: This sheet is a summary. It may not cover all  possible information. If you have questions about this medicine, talk to your doctor, pharmacist, or health care provider.  2018 Elsevier/Gold Standard (2015-07-03 16:14:23)

## 2017-02-03 ENCOUNTER — Telehealth: Payer: Self-pay

## 2017-02-03 NOTE — Progress Notes (Signed)
Date:  02/01/2017   Name:  Brenda Briggs   DOB:  April 28, 1951   MRN:  [id number]  PCP:  Schuyler Amor, MD    Chief Complaint: Cough (wheezing and coughing. Cough started in January and wheezing started 3 days ago. )   History of Present Illness:  This is a 66 y.o. female seen for 10 month f/u. C/o cough x 19m with clear phlegm and occ wheezing, also rhinorrhea and sinus congestion. Has OTC Zyrtec and Nasacort AQ but not taking regularly. Still smoking, has tried gum and patches without benefit. Taking vit D supplement, RLS sxs stable. Never got Zostrix imm.  Review of Systems:  Review of Systems  Constitutional: Negative for chills and fever.  HENT: Negative for ear pain.   Cardiovascular: Negative for chest pain, palpitations and leg swelling.  Gastrointestinal: Negative for abdominal pain.  Genitourinary: Negative for difficulty urinating.  Neurological: Negative for syncope and light-headedness.    Patient Active Problem List   Diagnosis Date Noted  . Allergic rhinitis 02/01/2017  . Vitamin D deficiency 02/01/2017  . Smoker 04/13/2016  . Restless leg syndrome 04/13/2016  . Anxiety and depression 09/12/2014    Prior to Admission medications   Medication Sig Start Date End Date Taking? Authorizing Provider  acetaminophen (TYLENOL) 325 MG tablet Take 650 mg by mouth every 6 (six) hours as needed.   Yes Historical Provider, MD  Cholecalciferol (VITAMIN D) 2000 units CAPS Take 1 capsule (2,000 Units total) by mouth daily. 04/14/16  Yes Schuyler Amor, MD  cetirizine (ZYRTEC) 10 MG tablet Take 1 tablet (10 mg total) by mouth daily. 02/01/17   Schuyler Amor, MD  triamcinolone (NASACORT AQ) 55 MCG/ACT AERO nasal inhaler Place 2 sprays into the nose daily. 02/01/17   Schuyler Amor, MD  varenicline (CHANTIX PAK) 0.5 MG X 11 & 1 MG X 42 tablet Take one 0.5 mg tablet by mouth once daily for 3 days, then increase to one 0.5 mg tablet twice daily for 4 days, then increase to one 1 mg tablet twice  daily. 02/01/17   Schuyler Amor, MD    No Known Allergies  Past Surgical History:  Procedure Laterality Date  . TUBAL LIGATION      Social History  Substance Use Topics  . Smoking status: Current Every Day Smoker    Types: Cigarettes  . Smokeless tobacco: Never Used  . Alcohol use No    Family History  Problem Relation Age of Onset  . Heart attack Mother     Medication list has been reviewed and updated.  Physical Examination: BP 126/82   Pulse 67   Temp 98.1 F (36.7 C)   Resp 16   Ht  (1.702 m)   Wt 141 lb (64 kg)   LMP  (LMP Unknown)   SpO2 97%   BMI 22.08 kg/m   Physical Exam  Constitutional: She appears well-developed and well-nourished.  HENT:  Right Ear: External ear normal.  Left Ear: External ear normal.  Mouth/Throat: Oropharynx is clear and moist.  TMs clear No sinus tenderness  Neck: Neck supple.  Cardiovascular: Normal rate, regular rhythm and normal heart sounds.   Pulmonary/Chest: Effort normal and breath sounds normal. She has no wheezes. She has no rales.  Musculoskeletal: She exhibits no edema.  Lymphadenopathy:    She has no cervical adenopathy.  Neurological: She is alert.  Skin: Skin is warm and dry.  Psychiatric: She has a normal mood and affect. Her behavior is normal.  Nursing note  and vitals reviewed.   Assessment and Plan:  1. Chronic non-seasonal allergic rhinitis, unspecified trigger Take Zyrtec and Nasacort AQ daily, call if sxs worsen/persist  2. Smoker Trial Chantix  3. Vitamin D deficiency On supplement, declines repeat level  4. Restless leg syndrome Stable  Return in about 3 months (around 05/03/2017).  Dionne Ano. Kingsley Spittle MD [facility]  02/03/2017

## 2017-02-03 NOTE — Telephone Encounter (Signed)
Called to inquire on if we had mentioned labs to her. I left message but realized she declined labs and other services at this time including any vaccines. If she calls back I will explain to her that we will do labs at next physical.

## 2017-06-01 ENCOUNTER — Telehealth: Payer: Self-pay

## 2017-06-01 NOTE — Telephone Encounter (Signed)
Patient needs to come in for follow up and get 2nd pneumonia shot. Left message

## 2017-06-14 DIAGNOSIS — H04123 Dry eye syndrome of bilateral lacrimal glands: Secondary | ICD-10-CM | POA: Diagnosis not present

## 2017-06-27 ENCOUNTER — Ambulatory Visit: Payer: Medicare Other | Admitting: Family Medicine

## 2017-06-29 ENCOUNTER — Ambulatory Visit (INDEPENDENT_AMBULATORY_CARE_PROVIDER_SITE_OTHER): Payer: Medicare Other | Admitting: Family Medicine

## 2017-06-29 ENCOUNTER — Encounter: Payer: Self-pay | Admitting: Family Medicine

## 2017-06-29 VITALS — BP 138/82 | HR 60 | Resp 16 | Ht 67.0 in | Wt 140.0 lb

## 2017-06-29 DIAGNOSIS — F419 Anxiety disorder, unspecified: Secondary | ICD-10-CM | POA: Diagnosis not present

## 2017-06-29 DIAGNOSIS — J302 Other seasonal allergic rhinitis: Secondary | ICD-10-CM | POA: Diagnosis not present

## 2017-06-29 DIAGNOSIS — E559 Vitamin D deficiency, unspecified: Secondary | ICD-10-CM | POA: Diagnosis not present

## 2017-06-29 DIAGNOSIS — Z23 Encounter for immunization: Secondary | ICD-10-CM | POA: Diagnosis not present

## 2017-06-29 DIAGNOSIS — F17200 Nicotine dependence, unspecified, uncomplicated: Secondary | ICD-10-CM | POA: Diagnosis not present

## 2017-06-29 DIAGNOSIS — F329 Major depressive disorder, single episode, unspecified: Secondary | ICD-10-CM

## 2017-06-29 DIAGNOSIS — H6522 Chronic serous otitis media, left ear: Secondary | ICD-10-CM | POA: Diagnosis not present

## 2017-06-29 DIAGNOSIS — F32A Depression, unspecified: Secondary | ICD-10-CM

## 2017-06-29 DIAGNOSIS — G2581 Restless legs syndrome: Secondary | ICD-10-CM | POA: Diagnosis not present

## 2017-06-29 MED ORDER — ROPINIROLE HCL 0.25 MG PO TABS: 0.2500 mg | ORAL_TABLET | Freq: Every day | ORAL | 2 refills | Status: DC

## 2017-06-29 NOTE — Patient Instructions (Signed)
Try OTC Afrin nasal spray for three days only or OTC Sudafed 12 hour twice daily for three days, call if your symptoms persist

## 2017-06-29 NOTE — Progress Notes (Signed)
Date:  06/29/2017   Name:  Brenda Briggs   DOB:  01/16/1951   MRN:  [id number]  PCP:  Schuyler AmorPlonk, Savayah Waltrip, MD    Chief Complaint: Dizziness   History of Present Illness:  This is a 66 y.o. female seen for four month f/u. C/o intermittent dizziness since May, not othostatic, some L nasal congestion, taking Nasacort daily but not Zyrtec. RLS sxs worse, keeping up every night. Depression sxs manageable. Taking vit D supp. No recent falls. Never took Chantix due to cost, trying Nicoderm patch. Due for Pneumovax, declines flu imm.  Review of Systems:  Review of Systems  Constitutional: Negative for chills and fever.  HENT: Negative for ear discharge, sore throat and trouble swallowing.   Respiratory: Negative for cough and shortness of breath.   Cardiovascular: Negative for chest pain and leg swelling.  Genitourinary: Negative for difficulty urinating.  Neurological: Negative for syncope and light-headedness.  Hematological: Negative for adenopathy.    Patient Active Problem List   Diagnosis Date Noted  . Allergic rhinitis 02/01/2017  . Vitamin D deficiency 02/01/2017  . Smoker 04/13/2016  . Restless leg syndrome 04/13/2016  . Anxiety and depression 09/12/2014    Prior to Admission medications   Medication Sig Start Date End Date Taking? Authorizing Provider  acetaminophen (TYLENOL) 325 MG tablet Take 650 mg by mouth every 6 (six) hours as needed.   Yes [provider]  Cholecalciferol (VITAMIN D) 2000 units CAPS Take 1 capsule (2,000 Units total) by mouth daily. 04/14/16  Yes Myra Weng, Chrissie NoaWilliam, MD  nicotine (NICODERM CQ - DOSED IN MG/24 HOURS) 21 mg/24hr patch Place 21 mg onto the skin daily.   Yes [provider]  triamcinolone (NASACORT AQ) 55 MCG/ACT AERO nasal inhaler Place 2 sprays into the nose daily. 02/01/17  Yes Shiv Shuey, Chrissie NoaWilliam, MD  cetirizine (ZYRTEC) 10 MG tablet Take 1 tablet (10 mg total) by mouth daily. Patient not taking: Reported on 06/29/2017 02/01/17    Schuyler AmorPlonk, Megumi Treaster, MD  rOPINIRole (REQUIP) 0.25 MG tablet Take 1 tablet (0.25 mg total) by mouth at bedtime. 06/29/17   Schuyler AmorPlonk, Lodie Waheed, MD    No Known Allergies  Past Surgical History:  Procedure Laterality Date  . TUBAL LIGATION      Social History  Substance Use Topics  . Smoking status: Current Every Day Smoker    Types: Cigarettes  . Smokeless tobacco: Never Used  . Alcohol use No    Family History  Problem Relation Age of Onset  . Heart attack Mother     Medication list has been reviewed and updated.  Physical Examination: BP 138/82   Pulse 60   Resp 16   Ht 5\' 7"  (1.702 m)   Wt 140 lb (63.5 kg)   LMP  (LMP Unknown)   SpO2 98%   BMI 21.93 kg/m   Physical Exam  Constitutional: She appears well-developed and well-nourished.  HENT:  Right Ear: External ear normal.  Left Ear: External ear normal.  Nose: Nose normal.  Mouth/Throat: Oropharynx is clear and moist. No oropharyngeal exudate.  L TM serous, R TM normal  Neck: Neck supple.  Cardiovascular: Normal rate, regular rhythm and normal heart sounds.   Pulmonary/Chest: Effort normal and breath sounds normal.  Musculoskeletal: She exhibits no edema.  Lymphadenopathy:    She has no cervical adenopathy.  Neurological: She is alert.  Skin: Skin is warm and dry.  Psychiatric: She has a normal mood and affect. Her behavior is normal.  Nursing note and vitals reviewed.  Assessment and Plan:  1. Left chronic serous otitis media Likely due to AR/ETD, trial Afrin or Sudafed 12h x 3d only, consider ENT referral if ineffective  2. Seasonal allergic rhinitis, unspecified trigger Restart Zyrtec, cont Nasacort  3. Anxiety and depression Well controlled off meds, consdier repeat B12 next visit  4. Restless leg syndrome Requip 0.25 mg qhs, consider gabapentin if ineffective  5. Vitamin D deficiency On supplement, consider recheck level next visit  6. Smoker Nicoderm use discussed  7. Need for pneumococcal  vaccination - Pneumococcal polysaccharide vaccine 23-valent greater than or equal to 2yo subcutaneous/IM  Return in about 6 months (around 12/29/2017).  Dionne Ano. Kingsley Spittle MD [facility]  06/29/2017

## 2018-03-13 ENCOUNTER — Telehealth: Payer: Self-pay

## 2018-03-13 NOTE — Telephone Encounter (Signed)
Called pt to sched AWV w/ [facility]. LVM requesting returned call.  

## 2018-09-26 ENCOUNTER — Telehealth: Payer: Self-pay

## 2018-09-26 NOTE — Telephone Encounter (Signed)
Pt called in requesting an appointment for tomorrow, to be seen for bronchitis. Explained to the patient that we don't have any openings for tomorrow, however I could see her today at 3:40. Patient refused and said she would just go to the urgent care.

## 2019-10-03 ENCOUNTER — Encounter: Payer: Self-pay | Admitting: Emergency Medicine

## 2019-10-03 ENCOUNTER — Other Ambulatory Visit: Payer: Self-pay

## 2019-10-03 ENCOUNTER — Ambulatory Visit
Admission: EM | Admit: 2019-10-03 | Discharge: 2019-10-03 | Disposition: A | Discharge status: Home / Self Care | Payer: Medicare Other | Attending: Family Medicine | Admitting: Family Medicine

## 2019-10-03 DIAGNOSIS — J0100 Acute maxillary sinusitis, unspecified: Secondary | ICD-10-CM

## 2019-10-03 DIAGNOSIS — R05 Cough: Secondary | ICD-10-CM | POA: Diagnosis not present

## 2019-10-03 DIAGNOSIS — R059 Cough, unspecified: Secondary | ICD-10-CM

## 2019-10-03 MED ORDER — DOXYCYCLINE HYCLATE 100 MG PO CAPS: 100.0000 mg | ORAL_CAPSULE | Freq: Two times a day (BID) | ORAL | 0 refills | Status: DC

## 2019-10-03 MED ORDER — BENZONATATE 200 MG PO CAPS: 200.0000 mg | ORAL_CAPSULE | Freq: Three times a day (TID) | ORAL | 0 refills | Status: DC | PRN

## 2019-10-03 NOTE — ED Triage Notes (Signed)
Pt c/o sinus congestion, chest congestion, right ear pain, post nasal drip, shortness of breath, and cough. Started about 3 weeks ago. Declines COVID testing.

## 2019-10-03 NOTE — Discharge Instructions (Signed)
Medications as prescribed. ° °Take care ° °Dr. Cleston Lautner  °

## 2019-10-03 NOTE — ED Provider Notes (Signed)
[facility]    CSN: [id number] Arrival date & time: 10/03/19  0945  History   Chief Complaint Chief Complaint  Patient presents with  . Sinus Problem    HPI  68 year old female presents with respiratory complaints.  Patient reports that she has been sick for the past 3 weeks.  Reports sinus pressure and congestion, cough, chest congestion, ear pain, postnasal drip, shortness of breath.  She is a known smoker.  She has never been diagnosed with COPD but has a longstanding history of tobacco abuse.  Patient is concerned that she has a "bronchitis".  Denies fever.  Denies sick contacts.  She has had no relief in her symptoms.  No known exacerbating factors.  Additionally, patient reports recent laryngitis.  No medications or interventions tried.  No other associated symptoms.  No other complaints.  PMH, Surgical Hx, Family Hx, Social History reviewed and updated as below.  PMH: Patient Active Problem List   Diagnosis Date Noted  . Allergic rhinitis 02/01/2017  . Vitamin D deficiency 02/01/2017  . Smoker 04/13/2016  . Restless leg syndrome 04/13/2016  . Anxiety and depression 09/12/2014  Tobacco abuse  Past Surgical History:  Procedure Laterality Date  . TUBAL LIGATION     OB History   No obstetric history on file.    Home Medications    Prior to Admission medications   Medication Sig Start Date End Date Taking? Authorizing Provider  acetaminophen (TYLENOL) 325 MG tablet Take 650 mg by mouth every 6 (six) hours as needed.   Yes [provider]  Cholecalciferol (VITAMIN D) 2000 units CAPS Take 1 capsule (2,000 Units total) by mouth daily. 04/14/16  Yes Plonk, Chrissie Noa, MD  triamcinolone (NASACORT AQ) 55 MCG/ACT AERO nasal inhaler Place 2 sprays into the nose daily. 02/01/17  Yes Plonk, Chrissie Noa, MD  benzonatate (TESSALON) 200 MG capsule Take 1 capsule (200 mg total) by mouth 3 (three) times daily as needed for cough. 10/03/19   Tommie Sams, DO  doxycycline  (VIBRAMYCIN) 100 MG capsule Take 1 capsule (100 mg total) by mouth 2 (two) times daily. 10/03/19   Tommie Sams, DO  cetirizine (ZYRTEC) 10 MG tablet Take 1 tablet (10 mg total) by mouth daily. Patient not taking: Reported on 06/29/2017 02/01/17 10/03/19  Schuyler Amor, MD  rOPINIRole (REQUIP) 0.25 MG tablet Take 1 tablet (0.25 mg total) by mouth at bedtime. 06/29/17 10/03/19  Schuyler Amor, MD    Family History Family History  Problem Relation Age of Onset  . Heart attack Mother    Social History Social History   Tobacco Use  . Smoking status: Current Every Day Smoker    Types: Cigarettes  . Smokeless tobacco: Never Used  Substance Use Topics  . Alcohol use: No  . Drug use: No     Allergies   Patient has no known allergies.   Review of Systems Review of Systems  Constitutional: Negative for fever.  HENT: Positive for congestion, ear pain, postnasal drip, sinus pressure, sinus pain and voice change.   Respiratory: Positive for cough.    Physical Exam Triage Vital Signs ED Triage Vitals  Enc Vitals Group     BP 10/03/19 1021 (!) 179/78     Pulse Rate 10/03/19 1021 76     Resp 10/03/19 1021 20     Temp 10/03/19 1021 98.2 F (36.8 C)     Temp Source 10/03/19 1021 Oral     SpO2 10/03/19 1021 97 %  Weight 10/03/19 1016 140 lb (63.5 kg)     Height 10/03/19 1016 5\' 7"  (1.702 m)     Head Circumference --      Peak Flow --      Pain Score 10/03/19 1016 0     Pain Loc --      Pain Edu? --      Excl. in [place]? --    Updated Vital Signs BP (!) 179/78 (BP Location: Right Arm)   Pulse 76   Temp 98.2 F (36.8 C) (Oral)   Resp 20   Ht 5\' 7"  (1.702 m)   Wt 63.5 kg   LMP  (LMP Unknown)   SpO2 97%   BMI 21.93 kg/m   Visual Acuity Right Eye Distance:   Left Eye Distance:   Bilateral Distance:    Right Eye Near:   Left Eye Near:    Bilateral Near:     Physical Exam Vitals signs and nursing note reviewed.  Constitutional:      General: She is not in acute  distress.    Comments: Chronically ill-appearing female no acute distress.  HENT:     Head: Normocephalic and atraumatic.     Right Ear: Tympanic membrane normal.     Ears:     Comments: Left TM with effusion.    Nose: Congestion present. No rhinorrhea.     Right Sinus: Maxillary sinus tenderness present.     Left Sinus: Maxillary sinus tenderness present.     Mouth/Throat:     Pharynx: Posterior oropharyngeal erythema present. No oropharyngeal exudate.  Eyes:     General:        Right eye: No discharge.        Left eye: No discharge.     Conjunctiva/sclera: Conjunctivae normal.  Cardiovascular:     Rate and Rhythm: Normal rate and regular rhythm.     Heart sounds: No murmur.  Pulmonary:     Effort: Pulmonary effort is normal.     Breath sounds: Normal breath sounds. No wheezing or rales.  Neurological:     Mental Status: She is alert.  Psychiatric:        Mood and Affect: Mood normal.        Behavior: Behavior normal.    UC Treatments / Results  Labs (all labs ordered are listed, but only abnormal results are displayed) Labs Reviewed - No data to display  EKG   Radiology No results found.  Procedures Procedures (including critical care time)  Medications Ordered in UC Medications - No data to display  Initial Impression / Assessment and Plan / UC Course  I have reviewed the triage vital signs and the nursing notes.  Pertinent labs & imaging results that were available during my care of the patient were reviewed by me and considered in my medical decision making (see chart for details).    68 year old female presents with suspected sinusitis.  Treating with doxycycline and Tessalon Perles.  Final Clinical Impressions(s) / UC Diagnoses   Final diagnoses:  Subacute maxillary sinusitis  Cough     Discharge Instructions     Medications as prescribed.  Take care  Dr. Adriana Simasook    ED Prescriptions    Medication Sig Dispense Auth. Provider   doxycycline  (VIBRAMYCIN) 100 MG capsule Take 1 capsule (100 mg total) by mouth 2 (two) times daily. 14 capsule Peyton Rossner G, DO   benzonatate (TESSALON) 200 MG capsule Take 1 capsule (200 mg total) by mouth 3 (three)  times daily as needed for cough. 30 capsule Coral Spikes, DO     PDMP not reviewed this encounter.   Coral Spikes, [place] 10/03/19 1113

## 2021-03-17 DIAGNOSIS — Z23 Encounter for immunization: Secondary | ICD-10-CM | POA: Diagnosis not present

## 2021-07-10 DIAGNOSIS — Z23 Encounter for immunization: Secondary | ICD-10-CM | POA: Diagnosis not present

## 2022-02-13 ENCOUNTER — Other Ambulatory Visit: Payer: Self-pay

## 2022-02-13 ENCOUNTER — Ambulatory Visit
Admission: EM | Admit: 2022-02-13 | Discharge: 2022-02-13 | Disposition: A | Discharge status: Home / Self Care | Payer: Medicare HMO

## 2022-02-13 ENCOUNTER — Encounter: Payer: Self-pay | Admitting: Emergency Medicine

## 2022-02-13 DIAGNOSIS — B354 Tinea corporis: Secondary | ICD-10-CM

## 2022-02-13 DIAGNOSIS — R051 Acute cough: Secondary | ICD-10-CM | POA: Diagnosis not present

## 2022-02-13 DIAGNOSIS — J209 Acute bronchitis, unspecified: Secondary | ICD-10-CM | POA: Diagnosis not present

## 2022-02-13 MED ORDER — CLOTRIMAZOLE-BETAMETHASONE 1-0.05 % EX CREA: TOPICAL_CREAM | CUTANEOUS | 0 refills | Status: AC

## 2022-02-13 MED ORDER — AZITHROMYCIN 250 MG PO TABS: 250.0000 mg | ORAL_TABLET | Freq: Every day | ORAL | 0 refills | Status: DC

## 2022-02-13 NOTE — Discharge Instructions (Signed)
-  Symptoms consistent with bronchitis.  As we discussed, this is generally due to a virus but since you have been sick for 2-1/2 weeks and feel like you are getting worse, can try antibiotic at this time.  I have sent azithromycin to pharmacy. ?- Continue Mucinex and increasing fluid intake.  Consider NyQuil or Robitussin-DM at bedtime. ?- Try to stop smoking. ?- Your rash is consistent with ringworm.  I have sent a topical medication to the pharmacy which should take care of it over the next week.  Do not scratch as you can spread to other areas. ?- You should be seen again if you have a fever, worsening cough, increased chest discomfort or breathing difficulty or you are just feeling generally worse after you finish the antibiotic. ?

## 2022-02-13 NOTE — ED Triage Notes (Signed)
Patient c/o cough, congestion, and SOB that started 2 weeks ago.  Patient states that she thinks she has a spider bite to her back about a month ago.  Patient states that she still has red bumps at the site.  Patient denies fevers.  ?

## 2022-02-13 NOTE — ED Provider Notes (Signed)
?[facility] ? ? ? ?CSN: [id number] ?Arrival date & time: 02/13/22  0800 ? ? ?  ? ?History   ?Chief Complaint ?Chief Complaint  ?Patient presents with  ? Cough  ? Shortness of Breath  ? Insect Bite  ? ? ?HPI ?Brenda Briggs is a 71 y.o. female presenting for approximately 2 to 2.5-week history of cough that is productive of discolored yellowish sputum, chest pressure and shortness of breath.  Patient reports increased fatigue and aches as well.  No associated fever.  Patient is a smoker but denies ever being diagnosed with COPD.  She has had bronchitis before and believes she has bronchitis now.  She denies any known COVID exposure and says no one is sick around her.  Has been taking over-the-counter Mucinex but symptoms seem to be getting worse and not better.  Patient also presenting for concerns about a rash of her upper back that has been present for about a month.  She thinks it could be some sort of insect bite or sting but she does not recall any event prior to the rash.  It itches.  Denies any associated pain.  Has not treated with any OTC meds.  No other rashes.  No other complaints. ? ?HPI ? ?Past Medical History:  ?Diagnosis Date  ? Anxiety   ? Lost son 03/21/2013  ? ? ?Patient Active Problem List  ? Diagnosis Date Noted  ? Allergic rhinitis 02/01/2017  ? Vitamin D deficiency 02/01/2017  ? Smoker 04/13/2016  ? Restless leg syndrome 04/13/2016  ? Anxiety and depression 09/12/2014  ? ? ?Past Surgical History:  ?Procedure Laterality Date  ? TUBAL LIGATION    ? ? ?OB History   ?No obstetric history on file. ?  ? ? ? ?Home Medications   ? ?Prior to Admission medications   ?Medication Sig Start Date End Date Taking? Authorizing Provider  ?azithromycin (ZITHROMAX) 250 MG tablet Take 1 tablet (250 mg total) by mouth daily. Take first 2 tablets together, then 1 every day until finished. 02/13/22  Yes Danton Clap, PA-C  ?clotrimazole-betamethasone (LOTRISONE) cream Apply to affected area 2 times  daily prn 02/13/22 02/20/22 Yes Danton Clap, PA-C  ?fluticasone (FLONASE) 50 MCG/ACT nasal spray Place 1 spray into both nostrils daily.   Yes [provider]  ?acetaminophen (TYLENOL) 325 MG tablet Take 650 mg by mouth every 6 (six) hours as needed.    [provider]  ?Cholecalciferol (VITAMIN D) 2000 units CAPS Take 1 capsule (2,000 Units total) by mouth daily. 04/14/16   Plonk, Gwyndolyn Saxon, MD  ?triamcinolone (NASACORT AQ) 55 MCG/ACT AERO nasal inhaler Place 2 sprays into the nose daily. 02/01/17   Plonk, Gwyndolyn Saxon, MD  ?cetirizine (ZYRTEC) 10 MG tablet Take 1 tablet (10 mg total) by mouth daily. ?Patient not taking: Reported on 06/29/2017 02/01/17 10/03/19  Adline Potter, MD  ?rOPINIRole (REQUIP) 0.25 MG tablet Take 1 tablet (0.25 mg total) by mouth at bedtime. 06/29/17 10/03/19  Adline Potter, MD  ? ? ?Family History ?Family History  ?Problem Relation Age of Onset  ? Heart attack Mother   ? ? ?Social History ?Social History  ? ?Tobacco Use  ? Smoking status: Every Day  ?  Types: Cigarettes  ? Smokeless tobacco: Never  ?Vaping Use  ? Vaping Use: Never used  ?Substance Use Topics  ? Alcohol use: No  ? Drug use: No  ? ? ? ?Allergies   ?Patient has no known allergies. ? ? ?Review of Systems ?Review of  Systems  ?Constitutional:  Positive for fatigue. Negative for chills, diaphoresis and fever.  ?HENT:  Positive for congestion. Negative for ear pain, rhinorrhea, sinus pressure, sinus pain and sore throat.   ?Respiratory:  Positive for cough, shortness of breath and wheezing.   ?Cardiovascular:  Positive for chest pain ("pressure" when coughing).  ?Gastrointestinal:  Negative for abdominal pain, nausea and vomiting.  ?Musculoskeletal:  Positive for myalgias. Negative for arthralgias.  ?Skin:  Positive for rash.  ?Neurological:  Negative for weakness and headaches.  ?Hematological:  Negative for adenopathy.  ? ? ?Physical Exam ?Triage Vital Signs ?ED Triage Vitals  ?Enc Vitals Group  ?   BP   ?   Pulse   ?    Resp   ?   Temp   ?   Temp src   ?   SpO2   ?   Weight   ?   Height   ?   Head Circumference   ?   Peak Flow   ?   Pain Score   ?   Pain Loc   ?   Pain Edu?   ?   Excl. in [place]?   ? ?No data found. ? ?Updated Vital Signs ?BP 125/79 (BP Location: Left Arm)   Pulse 94   Temp 98.3 ?F (36.8 ?C) (Oral)   Resp 14   Ht 5\' 7"  (1.702 m)   Wt 120 lb (54.4 kg)   LMP  (LMP Unknown)   SpO2 99%   BMI 18.79 kg/m?  ?   ? ?Physical Exam ?Vitals and nursing note reviewed.  ?Constitutional:   ?   General: She is not in acute distress. ?   Appearance: Normal appearance. She is ill-appearing. She is not toxic-appearing.  ?   Comments: Patient is very thin  ?HENT:  ?   Head: Normocephalic and atraumatic.  ?   Nose: Congestion present.  ?   Mouth/Throat:  ?   Mouth: Mucous membranes are moist.  ?   Pharynx: Oropharynx is clear.  ?Eyes:  ?   General: No scleral icterus.    ?   Right eye: No discharge.     ?   Left eye: No discharge.  ?   Conjunctiva/sclera: Conjunctivae normal.  ?Cardiovascular:  ?   Rate and Rhythm: Normal rate and regular rhythm.  ?   Heart sounds: Normal heart sounds.  ?Pulmonary:  ?   Effort: Pulmonary effort is normal. No respiratory distress.  ?   Breath sounds: Normal breath sounds. No wheezing, rhonchi or rales.  ?Musculoskeletal:  ?   Cervical back: Neck supple.  ?Skin: ?   General: Skin is dry.  ?   Findings: Rash (slightly erythematous dry ovular patch of upper back with central clearing) present.  ?Neurological:  ?   General: No focal deficit present.  ?   Mental Status: She is alert. Mental status is at baseline.  ?   Motor: No weakness.  ?   Gait: Gait normal.  ?Psychiatric:     ?   Mood and Affect: Mood normal.     ?   Behavior: Behavior normal.     ?   Thought Content: Thought content normal.  ? ? ? ?UC Treatments / Results  ?Labs ?(all labs ordered are listed, but only abnormal results are displayed) ?Labs Reviewed - No data to display ? ?EKG ? ? ?Radiology ?No results  found. ? ?Procedures ?Procedures (including critical care time) ? ?Medications Ordered in UC ?Medications -  No data to display ? ?Initial Impression / Assessment and Plan / UC Course  ?I have reviewed the triage vital signs and the nursing notes. ? ?Pertinent labs & imaging results that were available during my care of the patient were reviewed by me and considered in my medical decision making (see chart for details). ? ?71 year old female presenting for cough, congestion, shortness of breath x2.5 weeks.  Reports chest pressure when coughing as well as shortness of breath.  Patient is a longtime smoker for greater than 40 years.  Denies any previous diagnosis of COPD but says it has been suggested to her as a possibility in the past.  Patient also presenting for dry itchy rash of upper back for the past 1 month.  Denies any recent changes to its appearance. ? ?Vitals all normal and stable.  Patient appears that she feels unwell but she is nontoxic.  Mild nasal congestion.  Chest is clear to auscultation and heart regular rate and rhythm.  Patient's rash is of the upper back and it is circular, slightly erythematous and with central clearing.  Consistent with tinea corporis. ? ?Suspect patient does have bronchitis.  She coughed several times in the exam room may have cleared any abnormal breath sounds.  Patient is a longtime smoker of greater than 40 years.  I question the possibility of COPD.  Sent azithromycin to pharmacy.  This is the medication patient would like to take and I feel it is suitable.  Advised to continue the Mucinex and increasing rest and fluids.  Also sent Lotrisone cream.  Advised to follow-up for any worsening of symptoms or if not feeling better in the next week. ? ? ?Final Clinical Impressions(s) / UC Diagnoses  ? ?Final diagnoses:  ?Acute bronchitis, unspecified organism  ?Acute cough  ?Tinea corporis  ? ? ? ?Discharge Instructions   ? ?  ?-Symptoms consistent with bronchitis.  As we  discussed, this is generally due to a virus but since you have been sick for 2-1/2 weeks and feel like you are getting worse, can try antibiotic at this time.  I have sent azithromycin to pharmacy. ?- Continue Mucinex and increasing fluid intake.  Con

## 2022-02-24 ENCOUNTER — Ambulatory Visit
Admission: EM | Admit: 2022-02-24 | Discharge: 2022-02-24 | Disposition: A | Discharge status: Home / Self Care | Payer: Medicare HMO | Attending: Emergency Medicine | Admitting: Emergency Medicine

## 2022-02-24 ENCOUNTER — Ambulatory Visit (INDEPENDENT_AMBULATORY_CARE_PROVIDER_SITE_OTHER): Payer: Medicare HMO

## 2022-02-24 DIAGNOSIS — R059 Cough, unspecified: Secondary | ICD-10-CM | POA: Diagnosis not present

## 2022-02-24 DIAGNOSIS — J189 Pneumonia, unspecified organism: Secondary | ICD-10-CM | POA: Diagnosis not present

## 2022-02-24 DIAGNOSIS — J441 Chronic obstructive pulmonary disease with (acute) exacerbation: Secondary | ICD-10-CM | POA: Diagnosis not present

## 2022-02-24 DIAGNOSIS — R062 Wheezing: Secondary | ICD-10-CM

## 2022-02-24 MED ORDER — PROMETHAZINE-DM 6.25-15 MG/5ML PO SYRP: 5.0000 mL | ORAL_SOLUTION | Freq: Four times a day (QID) | ORAL | 0 refills | Status: AC | PRN

## 2022-02-24 MED ORDER — ALBUTEROL SULFATE HFA 108 (90 BASE) MCG/ACT IN AERS: 2.0000 | INHALATION_SPRAY | RESPIRATORY_TRACT | 0 refills | Status: AC | PRN

## 2022-02-24 MED ORDER — DOXYCYCLINE HYCLATE 100 MG PO CAPS: 100.0000 mg | ORAL_CAPSULE | Freq: Two times a day (BID) | ORAL | 0 refills | Status: AC

## 2022-02-24 MED ORDER — AMOXICILLIN-POT CLAVULANATE 875-125 MG PO TABS: 1.0000 | ORAL_TABLET | Freq: Two times a day (BID) | ORAL | 0 refills | Status: AC

## 2022-02-24 MED ORDER — AEROCHAMBER PLUS MISC: 2 refills | Status: AC

## 2022-02-24 MED ORDER — IPRATROPIUM-ALBUTEROL 0.5-2.5 (3) MG/3ML IN SOLN
3.0000 mL | Freq: Once | RESPIRATORY_TRACT | Status: AC
  Administered 2022-02-24: 3 mL via RESPIRATORY_TRACT

## 2022-02-24 NOTE — Discharge Instructions (Addendum)
2 puffs from your albuterol inhaler using your spacer.  Every 4 hours for 2 days, then every 6 hours for 2 days, then as needed.  You can back off on the albuterol if you start to improve sooner.  The antibiotics are to treat a pneumonia, and the steroids will help with inflammation.  Use the Promethazine DM cough syrup sparingly.  Please follow-up with your primary care provider ASAP for a chest CT.  It looks like you have a pneumonia on your x-ray, but in order to be sure that it is not something else, such as cancer, we need more information. ?

## 2022-02-24 NOTE — ED Triage Notes (Signed)
Pt present coughing with congestion and sob. Symptom started two weeks ago. Pt was recently seen on 4/15 for the same symptoms. Pt states symptoms has gotten worst.  ?

## 2022-02-24 NOTE — ED Provider Notes (Signed)
HPI ? ?SUBJECTIVE: ? ?Brenda Briggs is a 71 y.o. female who presents with 1 month of a cough productive of white phlegm.  She states that she feels as if she is choking on her phlegm.  She reports right sinus pain and pressure, postnasal drip, chest congestion, wheezing, shortness of breath, dyspnea on exertion and occasional posttussive emesis.  Unable to sleep at night secondary to the cough.  No fevers, nasal congestion.  No GERD or allergy symptoms.  She has been taking Flonase and Mucinex.  She is on day #4 of azithromycin that was prescribed to her for bronchitis.  Symptoms are worse with drinking milk.  No unintentional weight loss, night sweats.  She has a past medical history of sinusitis that is worse in the spring and fall.  No formal diagnosis of pulmonary disease, diabetes, hypertension, chronic kidney disease.  PCP: [facility] [place]. ? ?Patient was seen here on 4/15 for 2 to 2-1/2-week history of cough.  She has been smoking over the past 45 years.  She was sent home with azithromycin, Mucinex. ? ?Past Medical History:  ?Diagnosis Date  ? Anxiety   ? Lost son 03/21/2013  ? ? ?Past Surgical History:  ?Procedure Laterality Date  ? TUBAL LIGATION    ? ? ?Family History  ?Problem Relation Age of Onset  ? Heart attack Mother   ? ? ?Social History  ? ?Tobacco Use  ? Smoking status: Every Day  ?  Types: Cigarettes  ? Smokeless tobacco: Never  ?Vaping Use  ? Vaping Use: Never used  ?Substance Use Topics  ? Alcohol use: No  ? Drug use: No  ? ? ?No current facility-administered medications for this encounter. ? ?Current Outpatient Medications:  ?  albuterol (VENTOLIN HFA) 108 (90 Base) MCG/ACT inhaler, Inhale 2 puffs into the lungs every 4 (four) hours as needed for wheezing or shortness of breath. Dispense with aerochamber, Disp: 1 each, Rfl: 0 ?  amoxicillin-clavulanate (AUGMENTIN) 875-125 MG tablet, Take 1 tablet by mouth 2 (two) times daily for 7 days., Disp: 14 tablet, Rfl: 0 ?  citalopram  (CELEXA) 10 MG tablet, Take by mouth., Disp: , Rfl:  ?  doxycycline (VIBRAMYCIN) 100 MG capsule, Take 1 capsule (100 mg total) by mouth 2 (two) times daily for 5 days., Disp: 10 capsule, Rfl: 0 ?  promethazine-dextromethorphan (PROMETHAZINE-DM) 6.25-15 MG/5ML syrup, Take 5 mLs by mouth 4 (four) times daily as needed for cough., Disp: 118 mL, Rfl: 0 ?  Spacer/Aero-Holding Chambers (AEROCHAMBER PLUS) inhaler, Use with inhaler, Disp: 1 each, Rfl: 2 ?  Cholecalciferol (VITAMIN D) 2000 units CAPS, Take 1 capsule (2,000 Units total) by mouth daily., Disp: 30 capsule, Rfl:  ? ?No Known Allergies ? ? ?ROS ? ?As noted in HPI.  ? ?Physical Exam ? ?BP 94/65 (BP Location: Left Arm) Comment (BP Location): small adult cuff  Pulse 78   Temp 98.1 ?F (36.7 ?C) (Oral)   Resp 18   LMP  (LMP Unknown)   SpO2 96%  ? ?Constitutional: Well developed, well nourished, no acute distress ?Eyes:  EOMI, conjunctiva normal bilaterally ?HENT: Normocephalic, atraumatic,mucus membranes moist.  No nasal congestion.  No maxillary frontal sinus tenderness.  No obvious postnasal drip. ?Respiratory: Normal inspiratory effort diffuse expiratory wheezing, scattered rhonchi.  Prolonged expiratory phase. ?Cardiovascular: Normal rate, regular rhythm, no murmurs, rubs, gallops ?GI: nondistended ?skin: No rash, skin intact ?Musculoskeletal: no deformities ?Neurologic: Alert & oriented x 3, no focal neuro deficits ?Psychiatric: Speech and behavior appropriate ? ? ?  ED Course ? ? ?Medications  ?ipratropium-albuterol (DUONEB) 0.5-2.5 (3) MG/3ML nebulizer solution 3 mL (3 mLs Nebulization Given 02/24/22 0858)  ? ? ?Orders Placed This Encounter  ?Procedures  ? DG Chest 2 View  ?  Standing Status:   Standing  ?  Number of Occurrences:   1  ?  Order Specific Question:   Reason for Exam (SYMPTOM  OR DIAGNOSIS REQUIRED)  ?  Answer:   Cough 1 month, diffuse wheezing or rhonchi rule out pneumonia, COPD, bronchitis  ? Recheck vitals  ?  Standing Status:   Standing  ?   Number of Occurrences:   1  ? ? ?No results found for this or any previous visit (from the past 24 hour(s)). ?DG Chest 2 View ? ?Result Date: 02/24/2022 ?CLINICAL DATA:  Cough x1 month with diffuse wheezing and rhonchi. History of COPD and bronchitis. EXAM: CHEST - 2 VIEW COMPARISON:  No relevant priors available for comparison at time dictation. FINDINGS: The heart size and mediastinal contours are within normal limits. Aortic atherosclerosis. Chronic bronchitic lung changes. Masslike consolidation in the left lung base measures approximally 6.4 cm with adjacent interstitial thickening. The visualized skeletal structures are unremarkable. IMPRESSION: Chronic parenchymal lung changes with a masslike consolidation in the left lung base and adjacent interstitial thickening possibly reflecting an infectious process superimposed on chronic scarring. However neoplasm is a pertinent differential consideration. Recommend dedicated chest CT to further evaluate. Electronically Signed   By: Maudry MayhewJeffrey  Waltz M.D.   On: 02/24/2022 09:19   ? ?ED Clinical Impression ? ?1. Community acquired pneumonia of left lower lobe of lung   ?2. COPD exacerbation (HCC)   ?  ? ?ED Assessment/Plan ? ?Previous records reviewed.  As noted in HPI. ? ?Checking chest x-ray to evaluate for pneumonia.  Will give DuoNeb here because she is reporting shortness of breath with exertion and at rest.  Suspect sinusitis/COPD exacerbation.  Will reevaluate ? ?Reviewed imaging independently.  Masslike consolidation in left lung base and adjacent interstitial thickening.  Infectious process superimposed on chronic scarring versus neoplasm.  Dedicated chest CT recommended.  See radiology report for full details. ? ?On reevaluation, patient states she feels better.  Diffuse wheezing throughout all lung fields, improved air movement.  Discussed chest x-ray with her and the possibility of malignancy.  However, will start treatment for CAP.  Will send home with  Augmentin, doxycycline per up-to-date recommendations for CAP due to recent antibiotic use, prednisone 40 mg for 5 days, regularly scheduled albuterol with inhaler for the next 4 days, then as needed and have her follow-up with her primary care provider or pulmonology for outpatient chest CT. patient states that she will follow-up with her primary care provider. ? ?Discussed  imaging, MDM, treatment plan, and plan for follow-up with patient. patient agrees with plan.  ? ?Meds ordered this encounter  ?Medications  ? ipratropium-albuterol (DUONEB) 0.5-2.5 (3) MG/3ML nebulizer solution 3 mL  ? amoxicillin-clavulanate (AUGMENTIN) 875-125 MG tablet  ?  Sig: Take 1 tablet by mouth 2 (two) times daily for 7 days.  ?  Dispense:  14 tablet  ?  Refill:  0  ? doxycycline (VIBRAMYCIN) 100 MG capsule  ?  Sig: Take 1 capsule (100 mg total) by mouth 2 (two) times daily for 5 days.  ?  Dispense:  10 capsule  ?  Refill:  0  ? albuterol (VENTOLIN HFA) 108 (90 Base) MCG/ACT inhaler  ?  Sig: Inhale 2 puffs into the lungs every 4 (four) hours  as needed for wheezing or shortness of breath. Dispense with aerochamber  ?  Dispense:  1 each  ?  Refill:  0  ? Spacer/Aero-Holding Chambers (AEROCHAMBER PLUS) inhaler  ?  Sig: Use with inhaler  ?  Dispense:  1 each  ?  Refill:  2  ?  Please educate patient on use  ? promethazine-dextromethorphan (PROMETHAZINE-DM) 6.25-15 MG/5ML syrup  ?  Sig: Take 5 mLs by mouth 4 (four) times daily as needed for cough.  ?  Dispense:  118 mL  ?  Refill:  0  ? ? ? ? ?*This clinic note was created using Scientist, clinical (histocompatibility and immunogenetics). Therefore, there may be occasional mistakes despite careful proofreading. ? ?? ? ?  ?Domenick Gong, MD ?02/26/22 [phone] ? ?

## 2022-05-01 DEATH — deceased

## 2023-02-03 IMAGING — CR DG CHEST 2V
2 series · 2 of 2 positions shown · non-contrast
Comparison: No relevant priors available for comparison at time
dictation.

CLINICAL DATA: Cough x1 month with diffuse wheezing and rhonchi.
History of COPD and bronchitis.

EXAM:
CHEST - 2 VIEW

[chest pa]
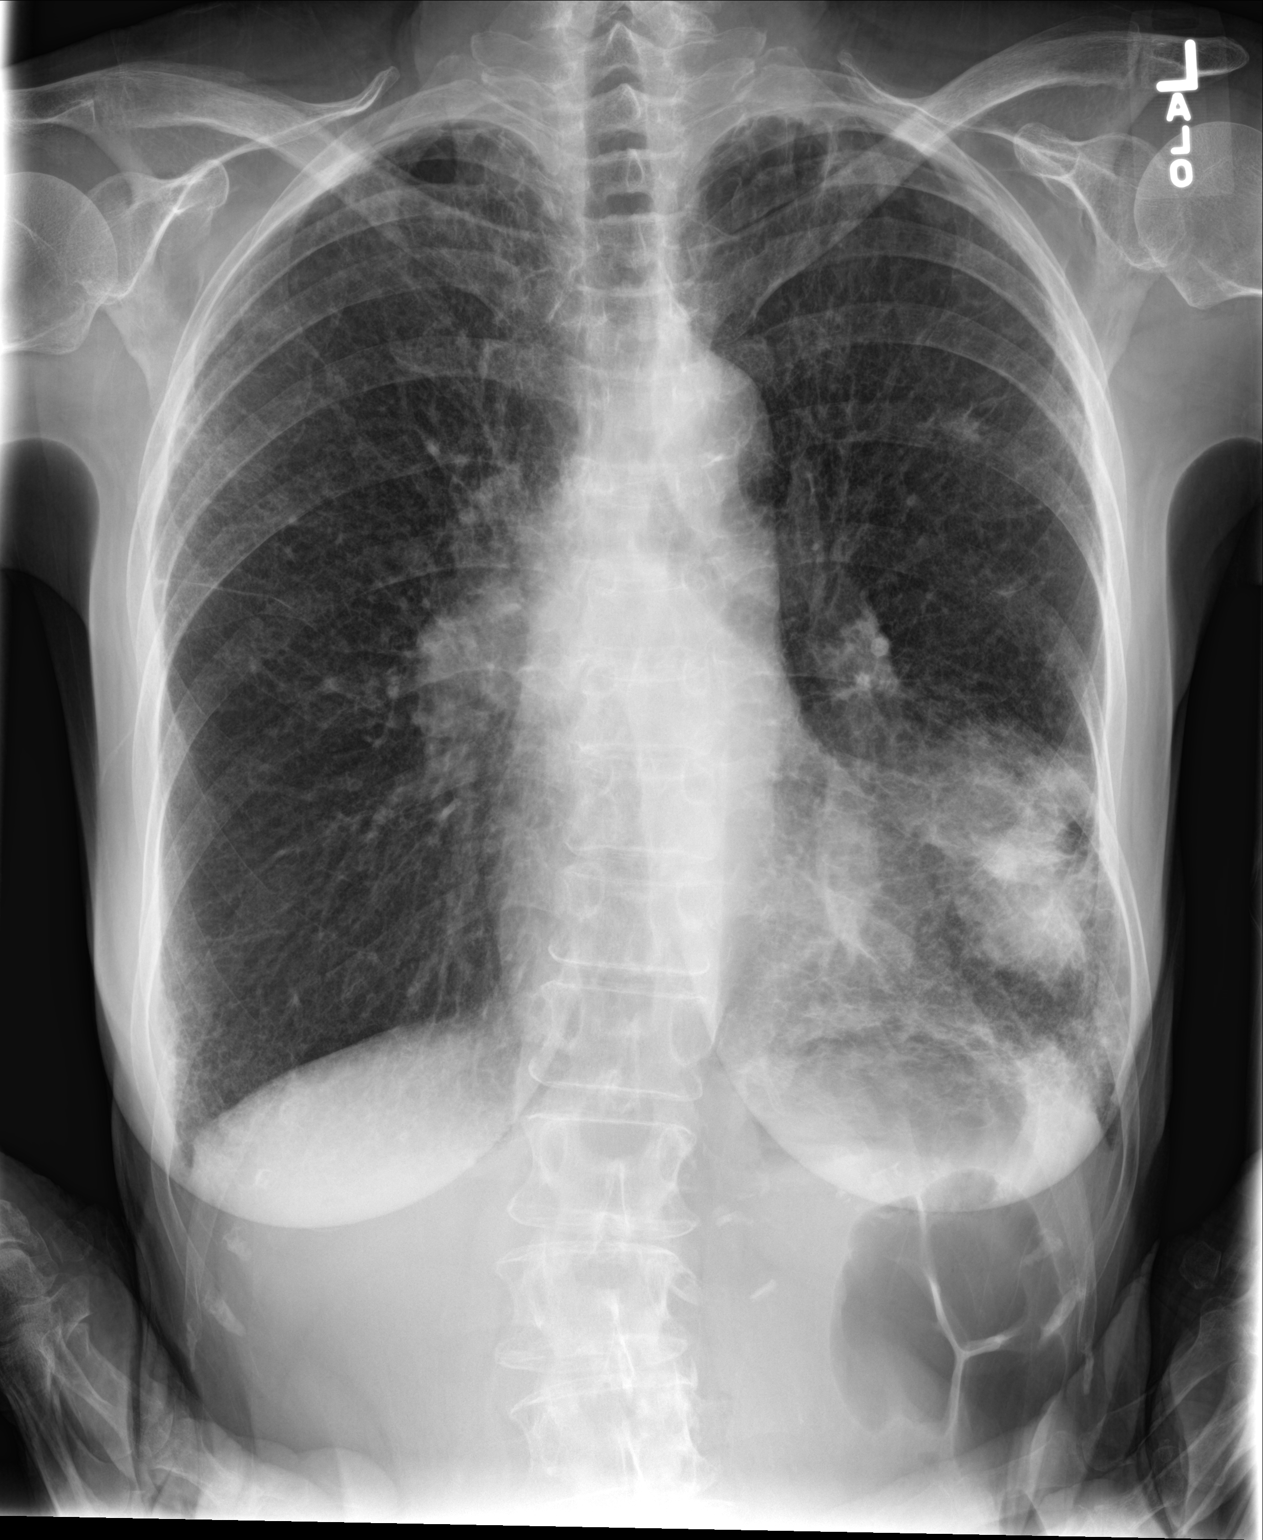

[chest lat]
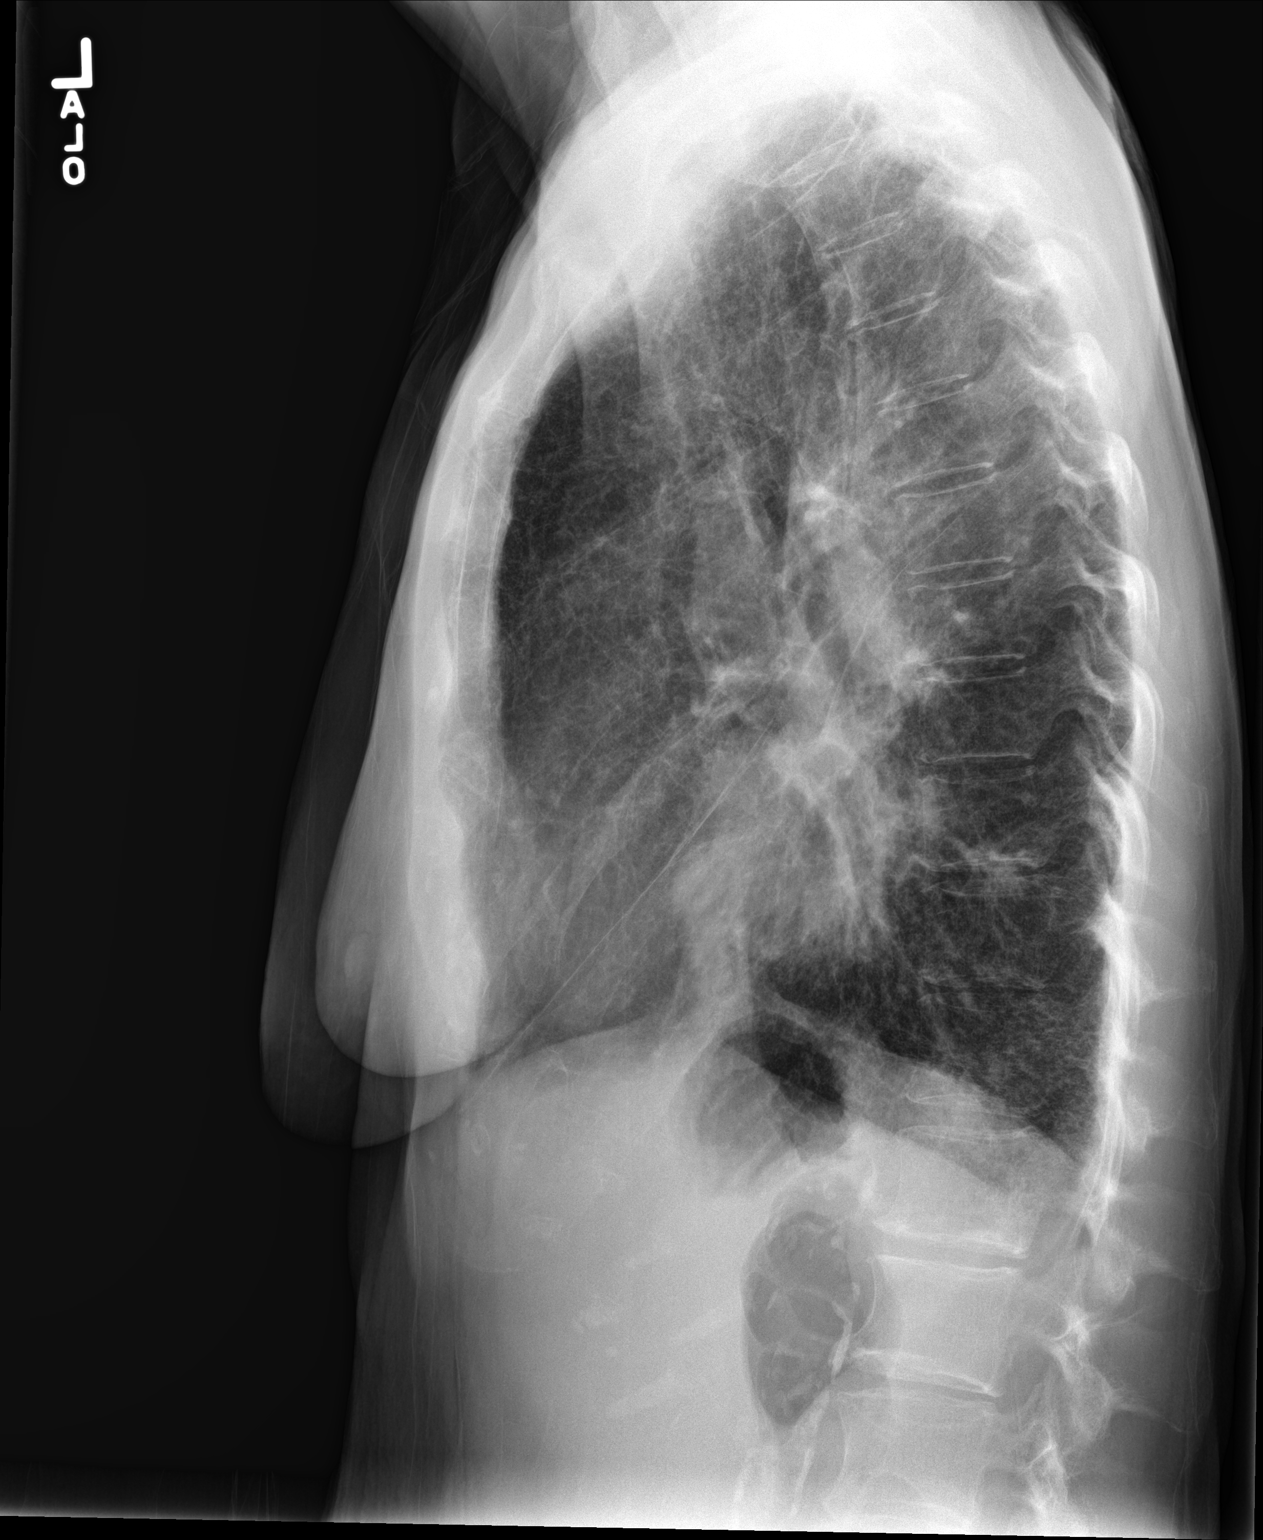

[2 of 2 positions shown; findings below may reference images not displayed]

FINDINGS: The heart size and mediastinal contours are within normal limits.
Aortic atherosclerosis. Chronic bronchitic lung changes. Masslike
consolidation in the left lung base measures approximally 6.4 cm
with adjacent interstitial thickening. The visualized skeletal
structures are unremarkable.
IMPRESSION: Chronic parenchymal lung changes with a masslike consolidation in
the left lung base and adjacent interstitial thickening possibly
reflecting an infectious process superimposed on chronic scarring.
However neoplasm is a pertinent differential consideration.
Recommend dedicated chest CT to further evaluate.
